# Patient Record
Sex: Female | Born: 1985
Health system: Southern US, Community
[De-identification: ages and names within clinical notes are randomized; demographics above are authoritative.]

---
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
## 2007-02-27 ENCOUNTER — Observation Stay: Payer: Self-pay

## 2007-09-07 ENCOUNTER — Emergency Department: Payer: Self-pay | Admitting: Emergency Medicine

## 2007-11-05 ENCOUNTER — Emergency Department: Payer: Self-pay | Admitting: Emergency Medicine

## 2008-01-06 ENCOUNTER — Emergency Department: Payer: Self-pay | Admitting: Emergency Medicine

## 2008-04-30 ENCOUNTER — Emergency Department: Payer: Self-pay | Admitting: Emergency Medicine

## 2010-07-13 ENCOUNTER — Emergency Department: Payer: Self-pay | Admitting: Emergency Medicine

## 2011-05-02 ENCOUNTER — Inpatient Hospital Stay (HOSPITAL_COMMUNITY)
Admission: AD | Admit: 2011-05-02 | Discharge: 2011-05-02 | Discharge status: Against Medical Advice | Payer: Self-pay | Source: Ambulatory Visit | Attending: Obstetrics & Gynecology | Admitting: Obstetrics & Gynecology

## 2011-05-02 NOTE — Plan of Care (Signed)
Patient indicated to admitting that she is unable to stay and be seen. Has to leave to pick up a child. States she will return if she needs to.

## 2012-02-15 ENCOUNTER — Emergency Department: Payer: Self-pay | Admitting: Emergency Medicine

## 2012-02-15 LAB — URINALYSIS, COMPLETE
Blood: NEGATIVE
Nitrite: NEGATIVE
Ph: 5 (ref 4.5–8.0)

## 2012-02-15 LAB — COMPREHENSIVE METABOLIC PANEL
Albumin: 3.6 g/dL (ref 3.4–5.0)
Alkaline Phosphatase: 66 U/L (ref 50–136)
Anion Gap: 9 (ref 7–16)
BUN: 14 mg/dL (ref 7–18)
Bilirubin,Total: 0.2 mg/dL (ref 0.2–1.0)
Calcium, Total: 8.5 mg/dL (ref 8.5–10.1)
Creatinine: 0.99 mg/dL (ref 0.60–1.30)
EGFR (African American): >60
EGFR (Non-African Amer.): >60
Glucose: 117 mg/dL — ABNORMAL HIGH (ref 65–99)
Osmolality: 281 (ref 275–301)
Potassium: 3.4 mmol/L — ABNORMAL LOW (ref 3.5–5.1)
Sodium: 140 mmol/L (ref 136–145)
Total Protein: 7.7 g/dL (ref 6.4–8.2)

## 2012-02-15 LAB — LIPASE, BLOOD: Lipase: 140 U/L (ref 73–393)

## 2012-02-15 LAB — HCG, QUANTITATIVE, PREGNANCY: Beta Hcg, Quant.: 14 m[IU]/mL — ABNORMAL HIGH

## 2012-02-15 LAB — CBC
HGB: 12.7 g/dL (ref 12.0–16.0)
MCH: 27.7 pg (ref 26.0–34.0)
MCHC: 33.3 g/dL (ref 32.0–36.0)
RDW: 13.9 % (ref 11.5–14.5)

## 2013-11-06 ENCOUNTER — Emergency Department: Payer: Self-pay | Admitting: Emergency Medicine

## 2014-01-22 ENCOUNTER — Ambulatory Visit: Payer: Self-pay | Admitting: Internal Medicine

## 2015-03-05 DIAGNOSIS — R51 Headache: Secondary | ICD-10-CM | POA: Diagnosis not present

## 2015-03-05 DIAGNOSIS — F410 Panic disorder [episodic paroxysmal anxiety] without agoraphobia: Secondary | ICD-10-CM | POA: Diagnosis not present

## 2015-03-05 DIAGNOSIS — R011 Cardiac murmur, unspecified: Secondary | ICD-10-CM | POA: Diagnosis not present

## 2015-03-05 DIAGNOSIS — R079 Chest pain, unspecified: Secondary | ICD-10-CM | POA: Diagnosis not present

## 2015-04-26 MED FILL — AMOXICILLIN 500 MG CAPSULE: 500 | 7 days supply | Qty: 21 | Fill #0

## 2015-04-26 MED FILL — HYDROCODON-APAP 5-325: 5-325 | 4 days supply | Qty: 12 | Fill #0

## 2015-04-27 DIAGNOSIS — K029 Dental caries, unspecified: Secondary | ICD-10-CM | POA: Diagnosis not present

## 2015-04-27 DIAGNOSIS — K0889 Other specified disorders of teeth and supporting structures: Secondary | ICD-10-CM | POA: Diagnosis not present

## 2015-05-03 MED FILL — PENICILLIN VK 500 MG TABLET: 500 | 8 days supply | Qty: 32 | Fill #0

## 2015-05-03 MED FILL — HYDROCOD-IBU 7.5-200 TAB: 7.5-200 | 5 days supply | Qty: 20 | Fill #0

## 2015-08-08 DIAGNOSIS — Z0000 Encounter for general adult medical examination without abnormal findings: Secondary | ICD-10-CM | POA: Diagnosis not present

## 2015-08-08 DIAGNOSIS — Z1389 Encounter for screening for other disorder: Secondary | ICD-10-CM | POA: Diagnosis not present

## 2015-08-08 DIAGNOSIS — F321 Major depressive disorder, single episode, moderate: Secondary | ICD-10-CM | POA: Diagnosis not present

## 2015-08-08 DIAGNOSIS — E669 Obesity, unspecified: Secondary | ICD-10-CM | POA: Diagnosis not present

## 2015-08-08 DIAGNOSIS — Z113 Encounter for screening for infections with a predominantly sexual mode of transmission: Secondary | ICD-10-CM | POA: Diagnosis not present

## 2015-08-08 DIAGNOSIS — Z30432 Encounter for removal of intrauterine contraceptive device: Secondary | ICD-10-CM | POA: Diagnosis not present

## 2015-08-08 DIAGNOSIS — R300 Dysuria: Secondary | ICD-10-CM | POA: Diagnosis not present

## 2015-08-08 DIAGNOSIS — Z131 Encounter for screening for diabetes mellitus: Secondary | ICD-10-CM | POA: Diagnosis not present

## 2015-08-08 DIAGNOSIS — F17200 Nicotine dependence, unspecified, uncomplicated: Secondary | ICD-10-CM | POA: Diagnosis not present

## 2015-10-05 ENCOUNTER — Telehealth: Payer: Self-pay | Admitting: Family

## 2015-10-05 DIAGNOSIS — B373 Candidiasis of vulva and vagina: Secondary | ICD-10-CM

## 2015-10-05 DIAGNOSIS — B3731 Acute candidiasis of vulva and vagina: Secondary | ICD-10-CM

## 2015-10-05 MED ORDER — FLUCONAZOLE 150 MG PO TABS: 150.0000 mg | ORAL_TABLET | Freq: Once | ORAL | 0 refills | Status: AC

## 2015-10-05 MED FILL — FLUCONAZOLE 150 MG TABLET: 150 | 1 days supply | Qty: 1 | Fill #0

## 2015-10-05 NOTE — Progress Notes (Signed)

## 2015-10-31 DIAGNOSIS — N390 Urinary tract infection, site not specified: Secondary | ICD-10-CM | POA: Diagnosis not present

## 2015-10-31 DIAGNOSIS — A64 Unspecified sexually transmitted disease: Secondary | ICD-10-CM | POA: Diagnosis not present

## 2015-10-31 DIAGNOSIS — F329 Major depressive disorder, single episode, unspecified: Secondary | ICD-10-CM | POA: Diagnosis not present

## 2015-10-31 DIAGNOSIS — A5901 Trichomonal vulvovaginitis: Secondary | ICD-10-CM | POA: Diagnosis not present

## 2015-10-31 DIAGNOSIS — N898 Other specified noninflammatory disorders of vagina: Secondary | ICD-10-CM | POA: Diagnosis not present

## 2015-10-31 DIAGNOSIS — R7303 Prediabetes: Secondary | ICD-10-CM | POA: Diagnosis not present

## 2015-10-31 DIAGNOSIS — B9689 Other specified bacterial agents as the cause of diseases classified elsewhere: Secondary | ICD-10-CM | POA: Diagnosis not present

## 2015-10-31 DIAGNOSIS — F17200 Nicotine dependence, unspecified, uncomplicated: Secondary | ICD-10-CM | POA: Diagnosis not present

## 2015-10-31 DIAGNOSIS — Z79899 Other long term (current) drug therapy: Secondary | ICD-10-CM | POA: Diagnosis not present

## 2015-10-31 DIAGNOSIS — N760 Acute vaginitis: Secondary | ICD-10-CM | POA: Diagnosis not present

## 2015-11-18 ENCOUNTER — Encounter (HOSPITAL_COMMUNITY): Payer: Self-pay | Admitting: Emergency Medicine

## 2015-11-18 ENCOUNTER — Ambulatory Visit (HOSPITAL_COMMUNITY)
Admission: EM | Admit: 2015-11-18 | Discharge: 2015-11-18 | Disposition: A | Discharge status: Home / Self Care | Payer: 59 | Attending: Internal Medicine | Admitting: Internal Medicine

## 2015-11-18 DIAGNOSIS — S0501XA Injury of conjunctiva and corneal abrasion without foreign body, right eye, initial encounter: Secondary | ICD-10-CM

## 2015-11-18 MED ORDER — TETRACAINE HCL 0.5 % OP SOLN
1.0000 [drp] | Freq: Once | OPHTHALMIC | Status: AC
  Administered 2015-11-18: 1 [drp] via OPHTHALMIC

## 2015-11-18 MED ORDER — ERYTHROMYCIN 5 MG/GM OP OINT: TOPICAL_OINTMENT | OPHTHALMIC | 0 refills | Status: AC

## 2015-11-18 MED ORDER — TETRACAINE HCL 0.5 % OP SOLN
OPHTHALMIC | Status: AC
  Filled 2015-11-18: qty 2

## 2015-11-18 MED ORDER — LIDOCAINE-EPINEPHRINE-TETRACAINE (LET) SOLUTION: 3.0000 mL | Freq: Once | NASAL | Status: DC

## 2015-11-18 MED FILL — ERYTHROMYCIN EYE OINTMENT: 5 | 10 days supply | Qty: 4 | Fill #0

## 2015-11-18 NOTE — ED Triage Notes (Signed)
Reports abrasion to right eye onset last night.... Reports she slept w/her contact  Sx include: watery, redness, and irritation  A&O x4... NAD

## 2015-11-18 NOTE — ED Provider Notes (Signed)
CSN: [id number]     Arrival date & time 11/18/15  1026 History   None    Chief Complaint  Patient presents with  . Eye Pain   (Consider location/radiation/quality/duration/timing/severity/associated sxs/prior Treatment) Pt reports onset of irritation to her (R) eye last night. This am she awoke w/ increased pain and redness to same eye. Relates that she slept in her contacts prior to onset of irritation.    The history is provided by the patient.  Eye Pain  This is a new problem. The current episode started 12 to 24 hours ago. The problem occurs constantly. The problem has been gradually worsening. She has tried nothing for the symptoms.    History reviewed. No pertinent past medical history. History reviewed. No pertinent surgical history. History reviewed. No pertinent family history. Social History  Substance Use Topics  . Smoking status: Current Every Day Smoker  . Smokeless tobacco: Never Used  . Alcohol use Yes   OB History    No data available     Review of Systems  Eyes: Positive for pain.  All other systems reviewed and are negative.   Allergies  Review of patient's allergies indicates no known allergies.  Home Medications   Prior to Admission medications   Medication Sig Start Date End Date Taking? Authorizing Provider  erythromycin ophthalmic ointment Place a 1/2 inch ribbon of ointment into the lower eyelid 6 times a day. 11/18/15   Leanne ChangKatherine P Demarrio Menges, NP   Meds Ordered and Administered this Visit   Medications  tetracaine (PONTOCAINE) 0.5 % ophthalmic solution 1 drop (1 drop Right Eye Given 11/18/15 1205)    BP 143/83 (BP Location: Left Arm)   Pulse 64   Temp 98.3 F (36.8 C) (Oral)   Resp 12   SpO2 100%  No data found.   Physical Exam  Constitutional: She is oriented to person, place, and time. She appears well-developed and well-nourished.  HENT:  Head: Atraumatic.  Eyes: EOM and lids are normal. Pupils are equal, round, and reactive to  light. Lids are everted and swept, no foreign bodies found. Right eye exhibits no discharge. Left eye exhibits no discharge. Right conjunctiva is injected. Right conjunctiva has no hemorrhage. Left conjunctiva is not injected. Left conjunctiva has no hemorrhage. No scleral icterus.    Fluorescein uptake noted just lateral and superior to (R) inner canthus c/w corneal abrasion. No obvious fb.   Cardiovascular: Normal rate.   Pulmonary/Chest: Effort normal.  Neurological: She is alert and oriented to person, place, and time.  Skin: Skin is warm and dry.  Psychiatric: She has a normal mood and affect.    Urgent Care Course   Clinical Course    Procedures (including critical care time)  Labs Review Labs Reviewed - No data to display  Imaging Review No results found.   Visual Acuity Review  Right Eye Distance:   Left Eye Distance:   Bilateral Distance:    Right Eye Near:   Left Eye Near:    Bilateral Near:         MDM   1. Corneal abrasion, right, initial encounter   Rx: Erythromycin Opthal ointment (x 4-6 qd x 7 days) Strongly encouraged f/u w/ opthalmology (referral provided) IN 24-48 hrs if symptoms not improving or worsening. Home care discussed and provided in print.    Leanne ChangKatherine P Vrinda Heckstall, NP 11/21/15 0007    Roma KayserKatherine P Eshan Trupiano, NP 11/21/15 [id number]

## 2016-03-23 DIAGNOSIS — R300 Dysuria: Secondary | ICD-10-CM | POA: Diagnosis not present

## 2016-03-23 DIAGNOSIS — R10819 Abdominal tenderness, unspecified site: Secondary | ICD-10-CM | POA: Diagnosis not present

## 2016-03-23 DIAGNOSIS — R1032 Left lower quadrant pain: Secondary | ICD-10-CM | POA: Diagnosis not present

## 2016-03-23 DIAGNOSIS — R350 Frequency of micturition: Secondary | ICD-10-CM | POA: Diagnosis not present

## 2016-03-23 DIAGNOSIS — N760 Acute vaginitis: Secondary | ICD-10-CM | POA: Diagnosis not present

## 2016-03-23 DIAGNOSIS — R110 Nausea: Secondary | ICD-10-CM | POA: Diagnosis not present

## 2016-03-23 DIAGNOSIS — F17200 Nicotine dependence, unspecified, uncomplicated: Secondary | ICD-10-CM | POA: Diagnosis not present

## 2016-03-24 DIAGNOSIS — N760 Acute vaginitis: Secondary | ICD-10-CM | POA: Diagnosis not present

## 2016-03-24 DIAGNOSIS — R1032 Left lower quadrant pain: Secondary | ICD-10-CM | POA: Diagnosis not present

## 2016-03-24 DIAGNOSIS — F17200 Nicotine dependence, unspecified, uncomplicated: Secondary | ICD-10-CM | POA: Diagnosis not present

## 2016-03-24 DIAGNOSIS — R350 Frequency of micturition: Secondary | ICD-10-CM | POA: Diagnosis not present

## 2016-03-24 DIAGNOSIS — R10819 Abdominal tenderness, unspecified site: Secondary | ICD-10-CM | POA: Diagnosis not present

## 2016-03-24 DIAGNOSIS — R300 Dysuria: Secondary | ICD-10-CM | POA: Diagnosis not present

## 2016-03-28 MED FILL — metroNIDAZOLE 500 MG TABS: 500 | 7 days supply | Qty: 14 | Fill #0

## 2016-07-19 DIAGNOSIS — H5213 Myopia, bilateral: Secondary | ICD-10-CM | POA: Diagnosis not present

## 2016-08-13 DIAGNOSIS — Z1389 Encounter for screening for other disorder: Secondary | ICD-10-CM | POA: Diagnosis not present

## 2016-08-13 DIAGNOSIS — R7303 Prediabetes: Secondary | ICD-10-CM | POA: Diagnosis not present

## 2016-08-13 DIAGNOSIS — F331 Major depressive disorder, recurrent, moderate: Secondary | ICD-10-CM | POA: Diagnosis not present

## 2016-08-13 DIAGNOSIS — J029 Acute pharyngitis, unspecified: Secondary | ICD-10-CM | POA: Diagnosis not present

## 2017-08-31 ENCOUNTER — Telehealth: Payer: 59 | Admitting: Family

## 2017-08-31 DIAGNOSIS — B3731 Acute candidiasis of vulva and vagina: Secondary | ICD-10-CM

## 2017-08-31 DIAGNOSIS — N390 Urinary tract infection, site not specified: Secondary | ICD-10-CM

## 2017-08-31 DIAGNOSIS — B373 Candidiasis of vulva and vagina: Secondary | ICD-10-CM

## 2017-08-31 MED ORDER — CEPHALEXIN 500 MG PO CAPS: 500.0000 mg | ORAL_CAPSULE | Freq: Two times a day (BID) | ORAL | 0 refills | Status: DC

## 2017-08-31 MED ORDER — FLUCONAZOLE 150 MG PO TABS: 150.0000 mg | ORAL_TABLET | Freq: Once | ORAL | 0 refills | Status: AC

## 2017-08-31 NOTE — Progress Notes (Signed)
Thank you for the details you included in the comment boxes. Those details are very helpful in determining the best course of treatment for you and help us to provide the best care. I will also send the Diflucan 150mg  x 1 dose for you.  We are sorry that you are not feeling well.  Here is how we plan to help!  Based on what you shared with me it looks like you most likely have a simple urinary tract infection.  A UTI (Urinary Tract Infection) is a bacterial infection of the bladder.  Most cases of urinary tract infections are simple to treat but a key part of your care is to encourage you to drink plenty of fluids and watch your symptoms carefully.  I have prescribed Keflex 500 mg twice a day for 7 days.  Your symptoms should gradually improve. Call us if the burning in your urine worsens, you develop worsening fever, back pain or pelvic pain or if your symptoms do not resolve after completing the antibiotic.  Urinary tract infections can be prevented by drinking plenty of water to keep your body hydrated.  Also be sure when you wipe, wipe from front to back and don't hold it in!  If possible, empty your bladder every 4 hours.  Your e-visit answers were reviewed by a board certified advanced clinical practitioner to complete your personal care plan.  Depending on the condition, your plan could have included both over the counter or prescription medications.  If there is a problem please reply  once you have received a response from your provider.  Your safety is important to us.  If you have drug allergies check your prescription carefully.    You can use MyChart to ask questions about today's visit, request a non-urgent call back, or ask for a work or school excuse for 24 hours related to this e-Visit. If it has been greater than 24 hours you will need to follow up with your provider, or enter a new e-Visit to address those concerns.   You will get an e-mail in the next two days asking about  your experience.  I hope that your e-visit has been valuable and will speed your recovery. Thank you for using e-visits.

## 2017-09-05 DIAGNOSIS — Z5321 Procedure and treatment not carried out due to patient leaving prior to being seen by health care provider: Secondary | ICD-10-CM | POA: Diagnosis not present

## 2017-09-06 DIAGNOSIS — N3001 Acute cystitis with hematuria: Secondary | ICD-10-CM | POA: Diagnosis not present

## 2017-09-06 DIAGNOSIS — L0291 Cutaneous abscess, unspecified: Secondary | ICD-10-CM | POA: Diagnosis not present

## 2017-09-06 MED FILL — DOXYCYCLINE HYCLATE 100 MG: 100 | 7 days supply | Qty: 14 | Fill #0

## 2017-09-12 DIAGNOSIS — L732 Hidradenitis suppurativa: Secondary | ICD-10-CM | POA: Diagnosis not present

## 2017-09-12 DIAGNOSIS — L0291 Cutaneous abscess, unspecified: Secondary | ICD-10-CM | POA: Diagnosis not present

## 2018-01-22 DIAGNOSIS — H5213 Myopia, bilateral: Secondary | ICD-10-CM | POA: Diagnosis not present

## 2018-02-28 ENCOUNTER — Encounter: Payer: Self-pay | Admitting: Physician Assistant

## 2018-02-28 DIAGNOSIS — R110 Nausea: Secondary | ICD-10-CM | POA: Diagnosis not present

## 2018-02-28 DIAGNOSIS — L298 Other pruritus: Secondary | ICD-10-CM | POA: Diagnosis not present

## 2018-02-28 DIAGNOSIS — F17200 Nicotine dependence, unspecified, uncomplicated: Secondary | ICD-10-CM | POA: Diagnosis not present

## 2018-02-28 DIAGNOSIS — L292 Pruritus vulvae: Secondary | ICD-10-CM | POA: Diagnosis not present

## 2018-02-28 DIAGNOSIS — R300 Dysuria: Secondary | ICD-10-CM | POA: Diagnosis not present

## 2018-02-28 DIAGNOSIS — R350 Frequency of micturition: Secondary | ICD-10-CM | POA: Diagnosis not present

## 2018-02-28 NOTE — Progress Notes (Signed)
This encounter was created in error - please disregard.

## 2018-03-01 DIAGNOSIS — R300 Dysuria: Secondary | ICD-10-CM | POA: Diagnosis not present

## 2018-03-01 DIAGNOSIS — L298 Other pruritus: Secondary | ICD-10-CM | POA: Diagnosis not present

## 2018-03-01 DIAGNOSIS — F17200 Nicotine dependence, unspecified, uncomplicated: Secondary | ICD-10-CM | POA: Diagnosis not present

## 2018-03-13 DIAGNOSIS — R7303 Prediabetes: Secondary | ICD-10-CM | POA: Diagnosis not present

## 2018-03-13 DIAGNOSIS — M791 Myalgia, unspecified site: Secondary | ICD-10-CM | POA: Diagnosis not present

## 2018-03-13 DIAGNOSIS — Z0000 Encounter for general adult medical examination without abnormal findings: Secondary | ICD-10-CM | POA: Diagnosis not present

## 2018-03-13 DIAGNOSIS — N941 Unspecified dyspareunia: Secondary | ICD-10-CM | POA: Diagnosis not present

## 2018-03-13 DIAGNOSIS — N939 Abnormal uterine and vaginal bleeding, unspecified: Secondary | ICD-10-CM | POA: Diagnosis not present

## 2018-03-13 DIAGNOSIS — R102 Pelvic and perineal pain: Secondary | ICD-10-CM | POA: Diagnosis not present

## 2018-03-24 MED FILL — metroNIDAZOLE 500 MG TABS: 500 | 7 days supply | Qty: 14 | Fill #0

## 2018-03-25 DIAGNOSIS — N839 Noninflammatory disorder of ovary, fallopian tube and broad ligament, unspecified: Secondary | ICD-10-CM | POA: Diagnosis not present

## 2018-03-25 DIAGNOSIS — N888 Other specified noninflammatory disorders of cervix uteri: Secondary | ICD-10-CM | POA: Diagnosis not present

## 2018-07-10 ENCOUNTER — Other Ambulatory Visit: Payer: Self-pay

## 2018-07-10 ENCOUNTER — Emergency Department (HOSPITAL_COMMUNITY): Payer: 59

## 2018-07-10 ENCOUNTER — Emergency Department (HOSPITAL_COMMUNITY)
Admission: EM | Admit: 2018-07-10 | Discharge: 2018-07-10 | Disposition: A | Discharge status: Home / Self Care | Payer: 59 | Attending: Emergency Medicine | Admitting: Emergency Medicine

## 2018-07-10 ENCOUNTER — Encounter (HOSPITAL_COMMUNITY): Payer: Self-pay

## 2018-07-10 DIAGNOSIS — M7918 Myalgia, other site: Secondary | ICD-10-CM | POA: Diagnosis not present

## 2018-07-10 DIAGNOSIS — Z03818 Encounter for observation for suspected exposure to other biological agents ruled out: Secondary | ICD-10-CM | POA: Diagnosis not present

## 2018-07-10 DIAGNOSIS — R509 Fever, unspecified: Secondary | ICD-10-CM | POA: Diagnosis not present

## 2018-07-10 DIAGNOSIS — Z20828 Contact with and (suspected) exposure to other viral communicable diseases: Secondary | ICD-10-CM | POA: Diagnosis not present

## 2018-07-10 DIAGNOSIS — F17200 Nicotine dependence, unspecified, uncomplicated: Secondary | ICD-10-CM | POA: Diagnosis not present

## 2018-07-10 DIAGNOSIS — R52 Pain, unspecified: Secondary | ICD-10-CM | POA: Insufficient documentation

## 2018-07-10 DIAGNOSIS — R05 Cough: Secondary | ICD-10-CM | POA: Diagnosis not present

## 2018-07-10 LAB — I-STAT BETA HCG BLOOD, ED (MC, WL, AP ONLY): I-stat hCG, quantitative: <5 m[IU]/mL (ref <5)

## 2018-07-10 LAB — SARS CORONAVIRUS 2 BY RT PCR (HOSPITAL ORDER, PERFORMED IN ~~LOC~~ HOSPITAL LAB): SARS Coronavirus 2: NEGATIVE

## 2018-07-10 MED ORDER — IBUPROFEN 400 MG PO TABS: 400.0000 mg | ORAL_TABLET | Freq: Four times a day (QID) | ORAL | 0 refills | Status: DC | PRN

## 2018-07-10 MED ORDER — IBUPROFEN 800 MG PO TABS
800.0000 mg | ORAL_TABLET | Freq: Once | ORAL | Status: AC
  Administered 2018-07-10: 800 mg via ORAL
  Filled 2018-07-10: qty 1

## 2018-07-10 MED ORDER — ACETAMINOPHEN 500 MG PO TABS
1000.0000 mg | ORAL_TABLET | Freq: Once | ORAL | Status: AC
  Administered 2018-07-10: 03:00:00 1000 mg via ORAL
  Filled 2018-07-10: qty 2

## 2018-07-10 NOTE — ED Provider Notes (Signed)
[facility]-EMERGENCY DEPT Provider Note   CSN: [id number] Arrival date & time: 07/10/18  0017    History   Chief Complaint Chief Complaint  Patient presents with  . Fever  . Generalized Body Aches    HPI Lisa Irwin is a 33 y.o. female.     The history is provided by the patient.  Fever  Max temp prior to arrival:  101 Temp source:  Oral Severity:  Mild Onset quality:  Sudden Timing:  Constant Progression:  Resolved (states she took her temp just PTA and it was 101 and she did not take any medication she came straight here.) Chronicity:  New Relieved by:  Nothing Worsened by:  Nothing Ineffective treatments:  None tried Associated symptoms: myalgias and rhinorrhea   Associated symptoms: no chest pain, no chills, no confusion, no congestion, no cough, no diarrhea, no dysuria, no ear pain, no nausea, no rash, no somnolence and no vomiting   Risk factors: no recent sickness   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  No CP, no SOB, no anosmia.    History reviewed. No pertinent past medical history.  There are no active problems to display for this patient.   History reviewed. No pertinent surgical history.   OB History   No obstetric history on file.      Home Medications    Prior to Admission medications   Medication Sig Start Date End Date Taking? Authorizing Provider  cephALEXin (KEFLEX) 500 MG capsule Take 1 capsule (500 mg total) by mouth 2 (two) times daily. 08/31/17   Withrow, Everardo All, FNP  erythromycin ophthalmic ointment Place a 1/2 inch ribbon of ointment into the lower eyelid 6 times a day. 11/18/15   Schorr, Roma Kayser, NP  ibuprofen (ADVIL) 400 MG tablet Take 1 tablet (400 mg total) by mouth every 6 (six) hours as needed. 07/10/18   Zeriyah Wain, MD    Family History History reviewed. No pertinent family history.  Social History Social History   Tobacco Use  . Smoking status:  Current Every Day Smoker  . Smokeless tobacco: Never Used  Substance Use Topics  . Alcohol use: Yes  . Drug use: Not on file     Allergies   Patient has no known allergies.   Review of Systems Review of Systems  Constitutional: Positive for fever. Negative for chills.  HENT: Positive for rhinorrhea. Negative for congestion and ear pain.   Eyes: Negative for photophobia.  Respiratory: Negative for cough and shortness of breath.   Cardiovascular: Negative for chest pain.  Gastrointestinal: Negative for diarrhea, nausea and vomiting.  Genitourinary: Negative for dysuria.  Musculoskeletal: Positive for myalgias. Negative for neck pain and neck stiffness.  Skin: Negative for rash.  Neurological: Negative for weakness and numbness.  Psychiatric/Behavioral: Negative for confusion.  All other systems reviewed and are negative.    Physical Exam Updated Vital Signs BP (!) 145/96 (BP Location: Left Arm)   Pulse 83   Temp 99.1 F (37.3 C) (Oral)   Resp 20   LMP 06/08/2018   SpO2 99%   Physical Exam Vitals signs and nursing note reviewed.  Constitutional:      General: She is not in acute distress. HENT:     Head: Normocephalic and atraumatic.     Nose: Nose normal.     Mouth/Throat:     Mouth: Mucous membranes are moist.     Pharynx: Oropharynx is clear.  Eyes:     Conjunctiva/sclera: Conjunctivae normal.     Pupils: Pupils are equal, round, and reactive to light.  Neck:     Musculoskeletal: Normal range of motion and neck supple.  Cardiovascular:     Rate and Rhythm: Normal rate and regular rhythm.     Pulses: Normal pulses.     Heart sounds: Normal heart sounds.  Pulmonary:     Effort: Pulmonary effort is normal. No respiratory distress.     Breath sounds: Normal breath sounds. No wheezing or rales.  Abdominal:     General: Abdomen is flat. Bowel sounds are normal.     Tenderness: There is no abdominal tenderness. There is no guarding or rebound.   Musculoskeletal: Normal range of motion.     Right lower leg: No edema.     Left lower leg: No edema.  Lymphadenopathy:     Cervical: No cervical adenopathy.  Skin:    General: Skin is warm and dry.     Capillary Refill: Capillary refill takes less than 2 seconds.  Neurological:     General: No focal deficit present.     Mental Status: She is alert and oriented to person, place, and time.     Deep Tendon Reflexes: Reflexes normal.  Psychiatric:        Mood and Affect: Mood normal.        Behavior: Behavior normal.      ED Treatments / Results  Labs (all labs ordered are listed, but only abnormal results are displayed) Results for orders placed or performed during the hospital encounter of 07/10/18  SARS Coronavirus 2 (CEPHEID- Performed in [facility] hospital lab), [facility] Order  Result Value Ref Range   SARS Coronavirus 2 NEGATIVE NEGATIVE  I-Stat Beta hCG blood, ED ([facility], [facility], [facility] only)  Result Value Ref Range   I-stat hCG, quantitative <5.0 <5 mIU/mL   Comment 3           Dg Chest Portable 1 View  Result Date: 07/10/2018 CLINICAL DATA:  33 year old female with cough body ache and fever. EXAM: PORTABLE CHEST 1 VIEW COMPARISON:  None. FINDINGS: Portable AP semi upright view at 0016 hours. Lung volumes and mediastinal contours are within normal limits. Visualized tracheal air column is within normal limits. Allowing for portable technique the lungs are clear. No osseous abnormality identified. Paucity of bowel gas in the upper abdomen. IMPRESSION: Negative portable chest Electronically Signed   By: Odessa Fleming M.D.   On: 07/10/2018 00:58    Radiology Dg Chest Portable 1 View  Result Date: 07/10/2018 CLINICAL DATA:  33 year old female with cough body ache and fever. EXAM: PORTABLE CHEST 1 VIEW COMPARISON:  None. FINDINGS: Portable AP semi upright view at 0016 hours. Lung volumes and mediastinal contours are within normal limits. Visualized tracheal air column is within normal limits.  Allowing for portable technique the lungs are clear. No osseous abnormality identified. Paucity of bowel gas in the upper abdomen. IMPRESSION: Negative portable chest Electronically Signed   By: Odessa Fleming M.D.   On: 07/10/2018 00:58    Procedures Procedures (including critical care time)  Medications Ordered in ED Medications  acetaminophen (TYLENOL) tablet 1,000 mg (has no administration in time range)  ibuprofen (ADVIL) tablet 800 mg (has no administration in time range)    Rapid covid is negative, patient is safe to return to work.    I suspect the thermometer was inaccurate as the patient took nothing and came straight here and is afebrile  in the ED.    I have reviewed her labs and Xray with the patient.    Based on CENTOR criteria there is no indication for further testing or treatment.    Ibuprofen sent to the patient's pharmacy.     Final Clinical Impressions(s) / ED Diagnoses   Final diagnoses:  Body aches   Return for intractable cough, coughing up blood,fevers >100.4 unrelieved by medication, shortness of breath, intractable vomiting, chest pain, shortness of breath, weakness,numbness, changes in speech, facial asymmetry,abdominal pain, passing out,Inability to tolerate liquids or food, cough, altered mental status or any concerns. No signs of systemic illness or infection. The patient is nontoxic-appearing on exam and vital signs are within normal limits.   I have reviewed the triage vital signs and the nursing notes. Pertinent labs &imaging results that were available during my care of the patient were reviewed by me and considered in my medical decision making (see chart for details).  After history, exam, and medical workup I feel the patient has been appropriately medically screened and is safe for discharge home. Pertinent diagnoses were discussed with the patient. Patient was given return precautions ED Discharge Orders         Ordered    ibuprofen  (ADVIL) 400 MG tablet  Every 6 hours PRN     07/10/18 0225           Analya Louissaint, MD 07/10/18 [id number]

## 2018-07-10 NOTE — ED Notes (Signed)
Bed: GE36 Expected date:  Expected time:  Means of arrival:  Comments: 38M cough from [place]

## 2018-07-10 NOTE — ED Triage Notes (Signed)
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

## 2018-07-17 ENCOUNTER — Encounter: Payer: Self-pay | Admitting: Emergency Medicine

## 2018-07-17 ENCOUNTER — Ambulatory Visit
Admission: EM | Admit: 2018-07-17 | Discharge: 2018-07-17 | Disposition: A | Discharge status: Home / Self Care | Payer: 59 | Attending: Physician Assistant | Admitting: Physician Assistant

## 2018-07-17 ENCOUNTER — Other Ambulatory Visit: Payer: Self-pay

## 2018-07-17 DIAGNOSIS — M25511 Pain in right shoulder: Secondary | ICD-10-CM

## 2018-07-17 MED ORDER — MELOXICAM 7.5 MG PO TABS: 7.5000 mg | ORAL_TABLET | Freq: Every day | ORAL | 0 refills | Status: DC

## 2018-07-17 MED ORDER — KETOROLAC TROMETHAMINE 30 MG/ML IJ SOLN
30.0000 mg | Freq: Once | INTRAMUSCULAR | Status: AC
  Administered 2018-07-17: 30 mg via INTRAMUSCULAR

## 2018-07-17 MED ORDER — METHOCARBAMOL 500 MG PO TABS: 500.0000 mg | ORAL_TABLET | Freq: Two times a day (BID) | ORAL | 0 refills | Status: DC

## 2018-07-17 MED FILL — METHOCARBAMOL 500 MG TABS: 500 | 10 days supply | Qty: 20 | Fill #0

## 2018-07-17 MED FILL — DOXYCYCLINE HYC 100 MG CAPS: 100 | 10 days supply | Qty: 20 | Fill #0

## 2018-07-17 MED FILL — MELOXICAM 7.5 MG TABLET: 7.5 | 7 days supply | Qty: 10 | Fill #0

## 2018-07-17 NOTE — ED Notes (Signed)
Patient able to ambulate independently  

## 2018-07-17 NOTE — ED Triage Notes (Signed)
Pt present to [facility] for assessment after waking up two weeks ago to right shoulder pain.  Shoulder hurts less with arm over head

## 2018-07-17 NOTE — Discharge Instructions (Signed)
Toradol injection in office today. Start Mobic. Do not take ibuprofen (motrin/advil)/ naproxen (aleve) while on mobic. Robaxin as needed, this can make you drowsy, so do not take if you are going to drive, operate heavy machinery, or make important decisions. Ice/heat compresses as needed. Follow up with PCP/orthopedics for further evaluation if continues to have shoulder/neck pain.

## 2018-07-17 NOTE — ED Provider Notes (Signed)
[facility]    CSN: [id number] Arrival date & time: 07/17/18  1448     History   Chief Complaint Chief Complaint  Patient presents with  . Shoulder Pain    HPI Lisa Irwin is a 33 y.o. female.   33 year old female comes in for 2 week history of right shoulder pain. Denies injury/trauma. Pain is to the neck as well, and was mild, but now more severe. States at first felt like she slept wrong, now with throbbing/pulsating/"twitching" sensation that occasionally radiates down the arm. Elevation helps with the pain. Denies trouble gripping, numbness/tingling. No heavy lifting. Took ibuprofen 400mg  one dose, heating pad, massage without relief.  Patient was seen at the ED for fever, body aches 1 week ago. Symptoms has since then resolved. No fever. Denies cough, shortness of breath.      History reviewed. No pertinent past medical history.  There are no active problems to display for this patient.   History reviewed. No pertinent surgical history.  OB History   No obstetric history on file.      Home Medications    Prior to Admission medications   Medication Sig Start Date End Date Taking? Authorizing Provider  erythromycin ophthalmic ointment Place a 1/2 inch ribbon of ointment into the lower eyelid 6 times a day. 11/18/15   Schorr, Roma Kayser, NP  ibuprofen (ADVIL) 400 MG tablet Take 1 tablet (400 mg total) by mouth every 6 (six) hours as needed. 07/10/18   Palumbo, April, MD  meloxicam (MOBIC) 7.5 MG tablet Take 1 tablet (7.5 mg total) by mouth daily. 07/17/18   Cathie Hoops,  V, PA-C  methocarbamol (ROBAXIN) 500 MG tablet Take 1 tablet (500 mg total) by mouth 2 (two) times daily. 07/17/18   Belinda Fisher, PA-C    Family History History reviewed. No pertinent family history.  Social History Social History   Tobacco Use  . Smoking status: Former Games developer  . Smokeless tobacco: Never Used  Substance Use Topics  . Alcohol use: Yes  . Drug use: Not on file      Allergies   Patient has no known allergies.   Review of Systems Review of Systems  Reason unable to perform ROS: See HPI as above.     Physical Exam Triage Vital Signs ED Triage Vitals  Enc Vitals Group     BP 07/17/18 1457 (!) 135/91     Pulse Rate 07/17/18 1457 71     Resp 07/17/18 1457 18     Temp 07/17/18 1457 97.8 F (36.6 C)     Temp Source 07/17/18 1457 Oral     SpO2 07/17/18 1457 98 %     Weight --      Height --      Head Circumference --      Peak Flow --      Pain Score 07/17/18 1456 7     Pain Loc --      Pain Edu? --      Excl. in GC? --    No data found.  Updated Vital Signs BP (!) 135/91 (BP Location: Left Arm)   Pulse 71   Temp 97.8 F (36.6 C) (Oral)   Resp 18   LMP 07/10/2018   SpO2 98%   Physical Exam Constitutional:      General: She is not in acute distress.    Appearance: She is well-developed. She is not diaphoretic.  HENT:     Head: Normocephalic and  atraumatic.  Eyes:     Conjunctiva/sclera: Conjunctivae normal.     Pupils: Pupils are equal, round, and reactive to light.  Cardiovascular:     Rate and Rhythm: Normal rate and regular rhythm.     Heart sounds: Normal heart sounds. No murmur. No friction rub. No gallop.   Pulmonary:     Effort: Pulmonary effort is normal. No accessory muscle usage or respiratory distress.     Breath sounds: Normal breath sounds. No stridor. No decreased breath sounds, wheezing, rhonchi or rales.  Musculoskeletal:     Comments: No rash, swelling, erythema. No tenderness on palpation of the spinous processes. Tenderness to palpation of right neck, shoulder. No bony tenderness. Full range of motion of neck and shoulder. Strength normal and equal bilaterally. Sensation intact and equal bilaterally.  Radial pulses 2+ and equal bilaterally. Capillary refill less than 2 seconds.   Skin:    General: Skin is warm and dry.  Neurological:     Mental Status: She is alert and oriented to person, place, and  time.     UC Treatments / Results  Labs (all labs ordered are listed, but only abnormal results are displayed) Labs Reviewed - No data to display  EKG None  Radiology No results found.  Procedures Procedures (including critical care time)  Medications Ordered in UC Medications  ketorolac (TORADOL) 30 MG/ML injection 30 mg (30 mg Intramuscular Given 07/17/18 1518)    Initial Impression / Assessment and Plan / UC Course  I have reviewed the triage vital signs and the nursing notes.  Pertinent labs & imaging results that were available during my care of the patient were reviewed by me and considered in my medical decision making (see chart for details).    Start NSAID as directed for pain and inflammation. Muscle relaxant as needed. Ice/heat compresses. Discussed with patient strain can take up to 3-4 weeks to resolve, but should be getting better each week. Return precautions given.   Final Clinical Impressions(s) / UC Diagnoses   Final diagnoses:  Acute pain of right shoulder    ED Prescriptions    Medication Sig Dispense Auth. Provider   meloxicam (MOBIC) 7.5 MG tablet Take 1 tablet (7.5 mg total) by mouth daily. 10 tablet ,  V, PA-C   methocarbamol (ROBAXIN) 500 MG tablet Take 1 tablet (500 mg total) by mouth 2 (two) times daily. 20 tablet Threasa Alpha,  V, PA-C        ,  V, [place] 07/17/18 1524

## 2018-08-22 DIAGNOSIS — N611 Abscess of the breast and nipple: Secondary | ICD-10-CM | POA: Diagnosis not present

## 2018-08-22 DIAGNOSIS — F17210 Nicotine dependence, cigarettes, uncomplicated: Secondary | ICD-10-CM | POA: Diagnosis not present

## 2018-08-22 DIAGNOSIS — L02213 Cutaneous abscess of chest wall: Secondary | ICD-10-CM | POA: Diagnosis not present

## 2018-08-26 DIAGNOSIS — N611 Abscess of the breast and nipple: Secondary | ICD-10-CM | POA: Diagnosis not present

## 2018-08-26 DIAGNOSIS — Z87891 Personal history of nicotine dependence: Secondary | ICD-10-CM | POA: Diagnosis not present

## 2018-08-29 DIAGNOSIS — Z4801 Encounter for change or removal of surgical wound dressing: Secondary | ICD-10-CM | POA: Diagnosis not present

## 2018-09-02 DIAGNOSIS — N611 Abscess of the breast and nipple: Secondary | ICD-10-CM | POA: Diagnosis not present

## 2018-09-02 DIAGNOSIS — O9989 Other specified diseases and conditions complicating pregnancy, childbirth and the puerperium: Secondary | ICD-10-CM | POA: Diagnosis not present

## 2018-09-02 DIAGNOSIS — Z4801 Encounter for change or removal of surgical wound dressing: Secondary | ICD-10-CM | POA: Diagnosis not present

## 2018-09-02 DIAGNOSIS — Z87891 Personal history of nicotine dependence: Secondary | ICD-10-CM | POA: Diagnosis not present

## 2018-09-02 MED FILL — CEPHALEXIN 500 MG CAPSULE: 500 | 5 days supply | Qty: 15 | Fill #0

## 2018-09-08 DIAGNOSIS — M25511 Pain in right shoulder: Secondary | ICD-10-CM | POA: Diagnosis not present

## 2018-09-08 DIAGNOSIS — M542 Cervicalgia: Secondary | ICD-10-CM | POA: Diagnosis not present

## 2018-09-11 DIAGNOSIS — R51 Headache: Secondary | ICD-10-CM | POA: Diagnosis not present

## 2018-09-11 DIAGNOSIS — Z3A01 Less than 8 weeks gestation of pregnancy: Secondary | ICD-10-CM | POA: Diagnosis not present

## 2018-09-11 DIAGNOSIS — O219 Vomiting of pregnancy, unspecified: Secondary | ICD-10-CM | POA: Diagnosis not present

## 2018-09-11 DIAGNOSIS — R197 Diarrhea, unspecified: Secondary | ICD-10-CM | POA: Diagnosis not present

## 2018-09-11 DIAGNOSIS — O9989 Other specified diseases and conditions complicating pregnancy, childbirth and the puerperium: Secondary | ICD-10-CM | POA: Diagnosis not present

## 2018-09-11 DIAGNOSIS — R630 Anorexia: Secondary | ICD-10-CM | POA: Diagnosis not present

## 2018-09-11 DIAGNOSIS — Z87891 Personal history of nicotine dependence: Secondary | ICD-10-CM | POA: Diagnosis not present

## 2018-09-11 DIAGNOSIS — O210 Mild hyperemesis gravidarum: Secondary | ICD-10-CM | POA: Diagnosis not present

## 2018-09-11 DIAGNOSIS — Z20828 Contact with and (suspected) exposure to other viral communicable diseases: Secondary | ICD-10-CM | POA: Diagnosis not present

## 2018-09-12 DIAGNOSIS — R51 Headache: Secondary | ICD-10-CM | POA: Diagnosis not present

## 2018-09-12 DIAGNOSIS — O210 Mild hyperemesis gravidarum: Secondary | ICD-10-CM | POA: Diagnosis not present

## 2018-09-12 DIAGNOSIS — O9989 Other specified diseases and conditions complicating pregnancy, childbirth and the puerperium: Secondary | ICD-10-CM | POA: Diagnosis not present

## 2018-09-12 DIAGNOSIS — Z3A01 Less than 8 weeks gestation of pregnancy: Secondary | ICD-10-CM | POA: Diagnosis not present

## 2018-09-12 DIAGNOSIS — Z20828 Contact with and (suspected) exposure to other viral communicable diseases: Secondary | ICD-10-CM | POA: Diagnosis not present

## 2018-09-12 DIAGNOSIS — R197 Diarrhea, unspecified: Secondary | ICD-10-CM | POA: Diagnosis not present

## 2018-09-12 DIAGNOSIS — R630 Anorexia: Secondary | ICD-10-CM | POA: Diagnosis not present

## 2018-09-12 DIAGNOSIS — Z87891 Personal history of nicotine dependence: Secondary | ICD-10-CM | POA: Diagnosis not present

## 2018-09-12 MED FILL — DOXYLAMINE-PYRIDOXINE 10-10: 10-10 | 30 days supply | Qty: 120 | Fill #0

## 2018-09-15 MED FILL — metroNIDAZOLE 500 MG TABS: 500 | 7 days supply | Qty: 14 | Fill #0

## 2018-09-16 DIAGNOSIS — O99341 Other mental disorders complicating pregnancy, first trimester: Secondary | ICD-10-CM | POA: Diagnosis not present

## 2018-09-16 DIAGNOSIS — Z3491 Encounter for supervision of normal pregnancy, unspecified, first trimester: Secondary | ICD-10-CM | POA: Diagnosis not present

## 2018-09-16 DIAGNOSIS — Z3480 Encounter for supervision of other normal pregnancy, unspecified trimester: Secondary | ICD-10-CM | POA: Diagnosis not present

## 2018-09-16 DIAGNOSIS — F329 Major depressive disorder, single episode, unspecified: Secondary | ICD-10-CM | POA: Diagnosis not present

## 2018-09-16 MED FILL — SERTRALINE HCL 25 MG TABLET: 25 | 30 days supply | Qty: 30 | Fill #0

## 2019-03-16 MED FILL — SERTRALINE HCL 25 MG TABLET: 25 | 30 days supply | Qty: 30 | Fill #1

## 2019-10-09 IMAGING — DX PORTABLE CHEST - 1 VIEW
1 series · 1 of 1 positions shown · non-contrast
Comparison: None.

CLINICAL DATA: 33-year-old female with cough body ache and fever.

EXAM:
PORTABLE CHEST 1 VIEW

[chest ap]
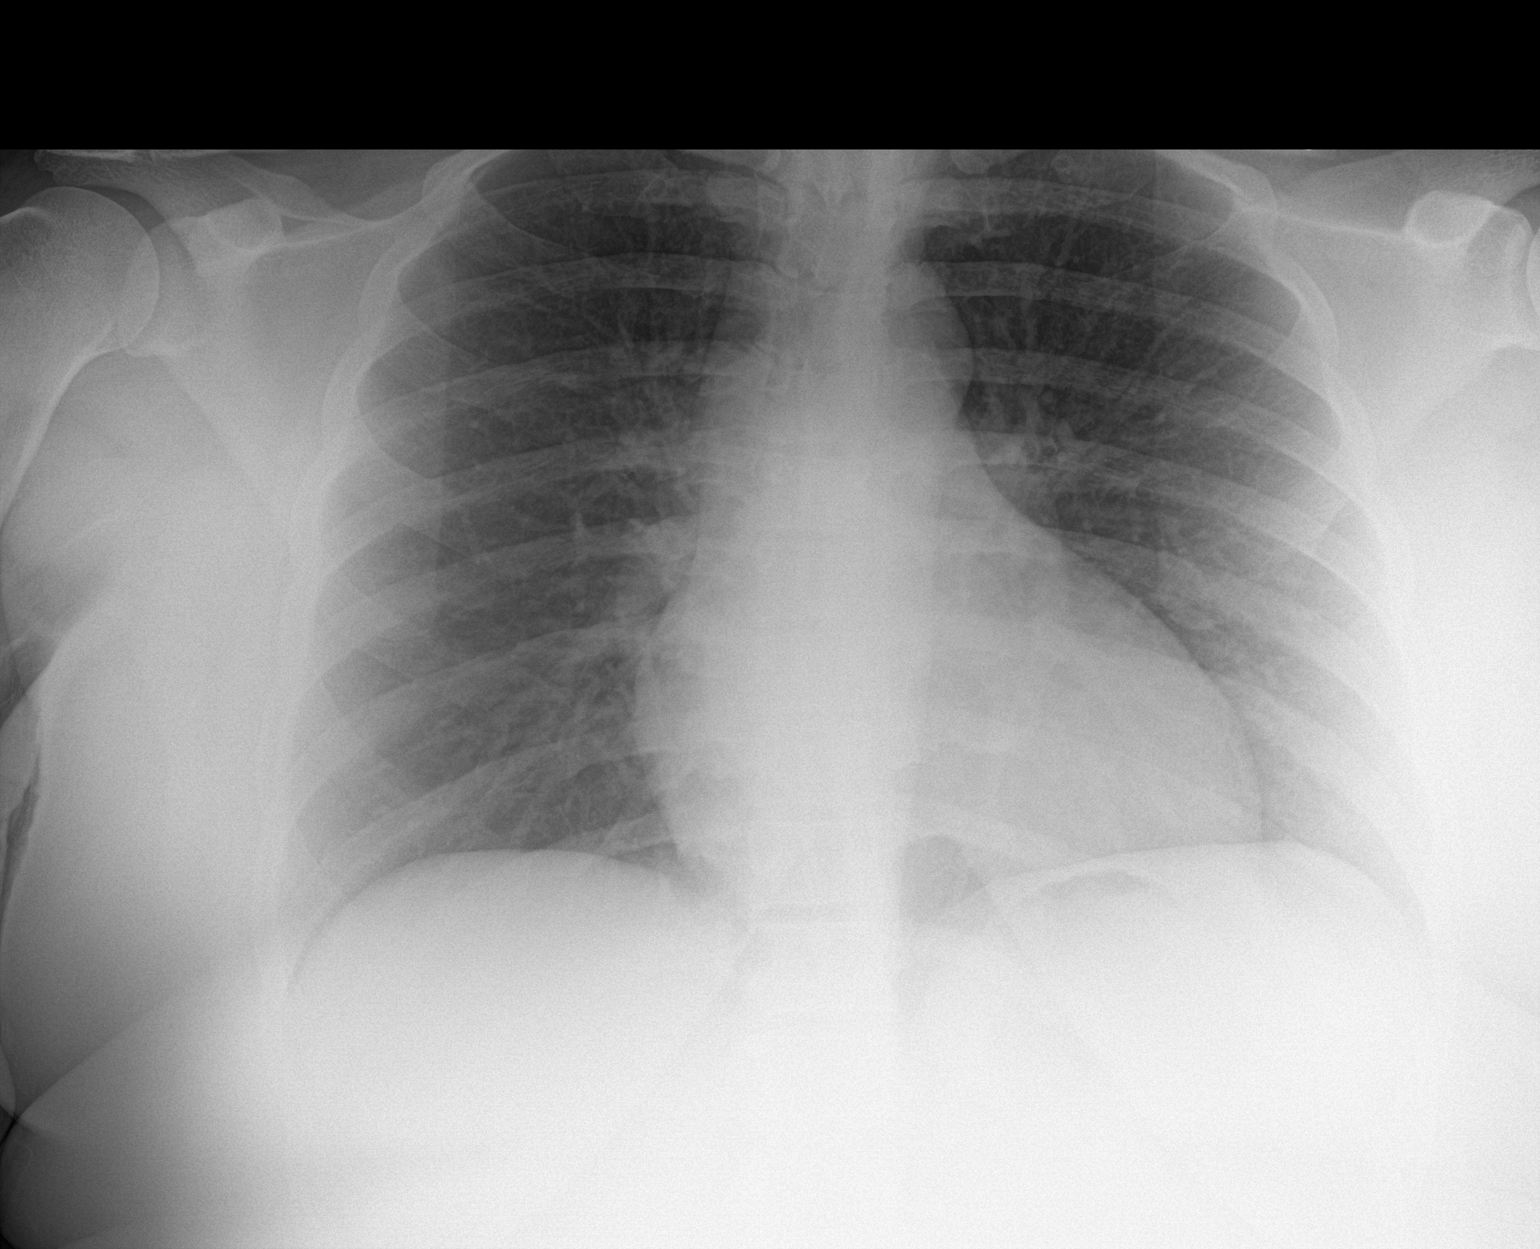

[1 of 1 positions shown; findings below may reference images not displayed]

FINDINGS: Portable AP semi upright view at 2239 hours. Lung volumes and
mediastinal contours are within normal limits. Visualized tracheal
air column is within normal limits. Allowing for portable technique
the lungs are clear. No osseous abnormality identified. Paucity of
bowel gas in the upper abdomen.
IMPRESSION: Negative portable chest

## 2020-03-26 ENCOUNTER — Inpatient Hospital Stay (HOSPITAL_COMMUNITY)
Admission: AD | Admit: 2020-03-26 | Discharge: 2020-03-26 | Disposition: A | Discharge status: Home / Self Care | Payer: 59 | Attending: Obstetrics and Gynecology | Admitting: Obstetrics and Gynecology

## 2020-03-26 ENCOUNTER — Telehealth: Payer: Self-pay | Admitting: Student

## 2020-03-26 ENCOUNTER — Other Ambulatory Visit: Payer: Self-pay

## 2020-03-26 ENCOUNTER — Encounter (HOSPITAL_COMMUNITY): Payer: Self-pay | Admitting: Obstetrics and Gynecology

## 2020-03-26 DIAGNOSIS — Z3202 Encounter for pregnancy test, result negative: Secondary | ICD-10-CM | POA: Diagnosis not present

## 2020-03-26 LAB — POCT PREGNANCY, URINE: Preg Test, Ur: NEGATIVE

## 2020-03-26 LAB — HCG, QUANTITATIVE, PREGNANCY: hCG, Beta Chain, Quant, S: <1 m[IU]/mL (ref <5)

## 2020-03-26 NOTE — Telephone Encounter (Signed)
Called patient and informed her of her bHCG less than 1. Explained that this value means she is not pregnant. She would like to have her tubes ligated; will send message to [name] to schedule patient for surgical consult. Explained that this would be a permanent procedure with no guarantee of reversal in the future. Patient verbalized understanding, all questions answered.   Lisa Irwin

## 2020-03-26 NOTE — MAU Note (Signed)
Pt reports to mau reporting headache, blurry vision,and lower right sided pain since yesterday.  Pt states she purchased an at home covid test as well as hpt.  States covid was neg, but had 1 faint positive preg test and 1 neg preg test.  Pt states her lmp was 03/05/20 and that she took an emergency contraception pill on 03/16/20.  Pt reports she has had both doses of covid vaccine

## 2020-03-26 NOTE — MAU Provider Note (Signed)
Patient Lisa Irwin is a 35 y.o. at Unknown here with complaints of blurry vision, diarrhea, HA. She vaginal bleeding, vaginal pain, abdominal pain. She denies She is vaccinated against COVID -19 but not boosted.  She also reports lots of hot flashes.   Last intercourse was 1/26; LMP 1/15. She also took Plan B on the 26. She reports that her periods are regular.   She thinks she has IBS; remarks that her "inflammatory markers" have been elevated.  Patient Vitals for the past 24 hrs:  BP Temp Temp src Pulse Resp SpO2  03/26/20 1424 131/82 98.3 F (36.8 C) Oral 70 17 100 %    -Pregnancy test here is negative, will draw quant. Confirmed patient's phone number in chart and will call in two hours.    Charlesetta Garibaldi Blanca Carreon 03/26/2020, 2:44 PM

## 2020-03-28 ENCOUNTER — Telehealth: Payer: Self-pay | Admitting: Obstetrics and Gynecology

## 2020-03-28 NOTE — Telephone Encounter (Signed)
LVM for pt to call back to sch Surgical consult for tubal per KK.

## 2020-05-13 ENCOUNTER — Inpatient Hospital Stay (HOSPITAL_COMMUNITY)
Admission: AD | Admit: 2020-05-13 | Discharge: 2020-05-13 | Disposition: A | Discharge status: Home / Self Care | Payer: 59 | Attending: Obstetrics and Gynecology | Admitting: Obstetrics and Gynecology

## 2020-05-13 ENCOUNTER — Other Ambulatory Visit: Payer: Self-pay

## 2020-05-13 ENCOUNTER — Other Ambulatory Visit (HOSPITAL_COMMUNITY): Payer: Self-pay | Admitting: Advanced Practice Midwife

## 2020-05-13 ENCOUNTER — Inpatient Hospital Stay (HOSPITAL_COMMUNITY): Payer: 59

## 2020-05-13 ENCOUNTER — Encounter (HOSPITAL_COMMUNITY): Payer: Self-pay | Admitting: Obstetrics and Gynecology

## 2020-05-13 DIAGNOSIS — Z87891 Personal history of nicotine dependence: Secondary | ICD-10-CM | POA: Diagnosis not present

## 2020-05-13 DIAGNOSIS — O3680X0 Pregnancy with inconclusive fetal viability, not applicable or unspecified: Secondary | ICD-10-CM

## 2020-05-13 DIAGNOSIS — O98811 Other maternal infectious and parasitic diseases complicating pregnancy, first trimester: Secondary | ICD-10-CM | POA: Insufficient documentation

## 2020-05-13 DIAGNOSIS — B373 Candidiasis of vulva and vagina: Secondary | ICD-10-CM | POA: Insufficient documentation

## 2020-05-13 DIAGNOSIS — Z3A01 Less than 8 weeks gestation of pregnancy: Secondary | ICD-10-CM

## 2020-05-13 DIAGNOSIS — N898 Other specified noninflammatory disorders of vagina: Secondary | ICD-10-CM

## 2020-05-13 DIAGNOSIS — O23591 Infection of other part of genital tract in pregnancy, first trimester: Secondary | ICD-10-CM

## 2020-05-13 DIAGNOSIS — B9689 Other specified bacterial agents as the cause of diseases classified elsewhere: Secondary | ICD-10-CM

## 2020-05-13 DIAGNOSIS — O99891 Other specified diseases and conditions complicating pregnancy: Secondary | ICD-10-CM | POA: Insufficient documentation

## 2020-05-13 DIAGNOSIS — M549 Dorsalgia, unspecified: Secondary | ICD-10-CM | POA: Diagnosis present

## 2020-05-13 DIAGNOSIS — O26891 Other specified pregnancy related conditions, first trimester: Secondary | ICD-10-CM | POA: Diagnosis not present

## 2020-05-13 DIAGNOSIS — R102 Pelvic and perineal pain: Secondary | ICD-10-CM | POA: Diagnosis not present

## 2020-05-13 LAB — URINALYSIS, ROUTINE W REFLEX MICROSCOPIC
Bacteria, UA: NONE SEEN
Bilirubin Urine: NEGATIVE
Glucose, UA: NEGATIVE mg/dL
Hgb urine dipstick: NEGATIVE
Ketones, ur: NEGATIVE mg/dL
Nitrite: NEGATIVE
Protein, ur: NEGATIVE mg/dL
Specific Gravity, Urine: 1.019 (ref 1.005–1.030)
pH: 5 (ref 5.0–8.0)

## 2020-05-13 LAB — CBC
HCT: 38.1 % (ref 36.0–46.0)
Hemoglobin: 12.2 g/dL (ref 12.0–15.0)
MCH: 26.2 pg (ref 26.0–34.0)
MCHC: 32 g/dL (ref 30.0–36.0)
MCV: 81.8 fL (ref 80.0–100.0)
Platelets: 268 10*3/uL (ref 150–400)
RBC: 4.66 MIL/uL (ref 3.87–5.11)
RDW: 15.5 % (ref 11.5–15.5)
WBC: 7.5 10*3/uL (ref 4.0–10.5)
nRBC: 0 % (ref 0.0–0.2)

## 2020-05-13 LAB — ABO/RH: ABO/RH(D): B POS

## 2020-05-13 LAB — WET PREP, GENITAL
Sperm: NONE SEEN
Trich, Wet Prep: NONE SEEN
Yeast Wet Prep HPF POC: NONE SEEN

## 2020-05-13 LAB — POCT PREGNANCY, URINE: Preg Test, Ur: POSITIVE — AB

## 2020-05-13 LAB — HCG, QUANTITATIVE, PREGNANCY: hCG, Beta Chain, Quant, S: 50052 m[IU]/mL — ABNORMAL HIGH (ref <5)

## 2020-05-13 MED ORDER — METRONIDAZOLE 500 MG PO TABS: 500.0000 mg | ORAL_TABLET | Freq: Two times a day (BID) | ORAL | 0 refills | Status: AC

## 2020-05-13 MED ORDER — TERCONAZOLE 0.4 % VA CREA: 1.0000 | TOPICAL_CREAM | Freq: Every day | VAGINAL | 0 refills | Status: AC

## 2020-05-13 MED FILL — TERCONAZOLE 0.4% VAG CREAM: 0.4 | 7 days supply | Qty: 45 | Fill #0

## 2020-05-13 MED FILL — metroNIDAZOLE 500 MG TABS: 500 | 7 days supply | Qty: 14 | Fill #0

## 2020-05-13 NOTE — MAU Note (Addendum)
Lisa Irwin is a 35 y.o. at [redacted]w[redacted]d here in MAU reporting: vaginal discomfort, burning, and itching since the beginning of the week. States having a small amount of discharge. Also having some back pain. No bleeding.  LMP: 03/31/20  Onset of complaint: ongoing  Pain score: 6/10  Vitals:   05/13/20 0728  BP: 124/84  Pulse: 73  Resp: 16  Temp: 98.1 F (36.7 C)  SpO2: 99%     Lab orders placed from triage: UA, UPT

## 2020-05-13 NOTE — Discharge Instructions (Signed)
Some natural remedies/prevention to try for bacterial vaginosis: --Take a probiotic tablet/capsule every day for at least 1-2 months.   --Whenever you have symptoms, use boric acid suppositories or tampons with coconut oil and 2-3 drops of tea tree oil vaginally every night for a week.   --Do not use scented soaps/perfumes in the vaginal area, and do not overwash multiple times daily. --Wear breathable cotton underwear and do not wear tight restrictive clothing. --Limit pantyliner use, change your underwear several times daily instead. --Use condoms during intercourse.  

## 2020-05-13 NOTE — MAU Provider Note (Signed)
Chief Complaint: Vaginal Discharge and Back Pain   Event Date/Time   First Provider Initiated Contact with Patient 05/13/20 0925     Vag itching, hx bv, some back abdom pain Works needs to come to [employer] sun instead of offic emonday   SUBJECTIVE HPI: Lisa Irwin is a 35 y.o. [phone] at [redacted]w[redacted]d by LMP who presents to maternity admissions reporting vaginal discharge with itching and mild lower abdominal cramping. She reports history of bacterial vaginosis but does not usually have itching. There are no other s/sx and she has not tried any treatments.  Location: lower abdomen Quality: cramping Severity: 2/10 on pain scale Duration: 1 day Timing: intermittent Modifying factors: none Associated signs and symptoms: vaginal discharge/itching  HPI  History reviewed. No pertinent past medical history. History reviewed. No pertinent surgical history. Social History   Socioeconomic History  . Marital status: Single    Spouse name: Not on file  . Number of children: Not on file  . Years of education: Not on file  . Highest education level: Not on file  Occupational History  . Not on file  Tobacco Use  . Smoking status: Former Games developer  . Smokeless tobacco: Never Used  Vaping Use  . Vaping Use: Never used  Substance and Sexual Activity  . Alcohol use: Not Currently  . Drug use: Never  . Sexual activity: Yes  Other Topics Concern  . Not on file  Social History Narrative  . Not on file   Social Determinants of Health   Financial Resource Strain: Not on file  Food Insecurity: Not on file  Transportation Needs: Not on file  Physical Activity: Not on file  Stress: Not on file  Social Connections: Not on file  Intimate Partner Violence: Not on file   No current facility-administered medications on file prior to encounter.   Current Outpatient Medications on File Prior to Encounter  Medication Sig Dispense Refill  . erythromycin ophthalmic ointment Place a 1/2 inch ribbon of  ointment into the lower eyelid 6 times a day. 1 g 0   No Known Allergies  ROS:  Review of Systems   I have reviewed patient's Past Medical Hx, Surgical Hx, Family Hx, Social Hx, medications and allergies.   Physical Exam   Patient Vitals for the past 24 hrs:  BP Temp Temp src Pulse Resp SpO2 Height Weight  05/13/20 1004 116/78 - - 72 - 100 % - -  05/13/20 0746 123/78 - - 76 - 99 % - -  05/13/20 0728 124/84 98.1 F (36.7 C) Oral 73 16 99 % 5\' 9"  (1.753 m) (!) 136.8 kg   Constitutional: Well-developed, well-nourished female in no acute distress.  Cardiovascular: normal rate Respiratory: normal effort GI: Abd soft, non-tender. Pos BS x 4 MS: Extremities nontender, no edema, normal ROM Neurologic: Alert and oriented x 4.  GU: Neg CVAT.  PELVIC EXAM: wet prep/GCC collected by blind swab   LAB RESULTS Results for orders placed or performed during the hospital encounter of 05/13/20 (from the past 24 hour(s))  Urinalysis, Routine w reflex microscopic     Status: Abnormal   Collection Time: 05/13/20  7:20 AM  Result Value Ref Range   Color, Urine YELLOW YELLOW   APPearance CLEAR CLEAR   Specific Gravity, Urine 1.019 1.005 - 1.030   pH 5.0 5.0 - 8.0   Glucose, UA NEGATIVE NEGATIVE mg/dL   Hgb urine dipstick NEGATIVE NEGATIVE   Bilirubin Urine NEGATIVE NEGATIVE   Ketones, ur NEGATIVE NEGATIVE  mg/dL   Protein, ur NEGATIVE NEGATIVE mg/dL   Nitrite NEGATIVE NEGATIVE   Leukocytes,Ua TRACE (A) NEGATIVE   RBC / HPF 0-5 0 - 5 RBC/hpf   WBC, UA 0-5 0 - 5 WBC/hpf   Bacteria, UA NONE SEEN NONE SEEN   Squamous Epithelial / LPF 0-5 0 - 5   Mucus PRESENT    Amorphous Crystal PRESENT   Pregnancy, urine POC     Status: Abnormal   Collection Time: 05/13/20  7:22 AM  Result Value Ref Range   Preg Test, Ur POSITIVE (A) NEGATIVE  Wet prep, genital     Status: Abnormal   Collection Time: 05/13/20  7:45 AM   Specimen: Vaginal  Result Value Ref Range   Yeast Wet Prep HPF POC NONE SEEN NONE  SEEN   Trich, Wet Prep NONE SEEN NONE SEEN   Clue Cells Wet Prep HPF POC PRESENT (A) NONE SEEN   WBC, Wet Prep HPF POC MODERATE (A) NONE SEEN   Sperm NONE SEEN   CBC     Status: None   Collection Time: 05/13/20  8:02 AM  Result Value Ref Range   WBC 7.5 4.0 - 10.5 K/uL   RBC 4.66 3.87 - 5.11 MIL/uL   Hemoglobin 12.2 12.0 - 15.0 g/dL   HCT 21.1 94.1 - 74.0 %   MCV 81.8 80.0 - 100.0 fL   MCH 26.2 26.0 - 34.0 pg   MCHC 32.0 30.0 - 36.0 g/dL   RDW 81.4 48.1 - 85.6 %   Platelets 268 150 - 400 K/uL   nRBC 0.0 0.0 - 0.2 %  hCG, quantitative, pregnancy     Status: Abnormal   Collection Time: 05/13/20  8:02 AM  Result Value Ref Range   hCG, Beta Chain, Quant, S 50,052 (H) <5 mIU/mL  ABO/Rh     Status: None   Collection Time: 05/13/20  8:02 AM  Result Value Ref Range   ABO/RH(D) B POS    No rh immune globuloin      NOT A RH IMMUNE GLOBULIN CANDIDATE, PT RH POSITIVE Performed at [facility], [address] [place], [place] [address]     --/--/B POS (03/25 0802)  IMAGING US OB LESS THAN 14 WEEKS WITH OB TRANSVAGINAL  Result Date: 05/13/2020 CLINICAL DATA:  Pelvic pain. EXAM: OBSTETRIC <14 WK [place] AND TRANSVAGINAL OB US TECHNIQUE: Both transabdominal and transvaginal ultrasound examinations were performed for complete evaluation of the gestation as well as the maternal uterus, adnexal regions, and pelvic cul-de-sac. Transvaginal technique was performed to assess early pregnancy. COMPARISON:  None. FINDINGS: Intrauterine gestational sac: Single Yolk sac:  Not Visualized. Embryo:  Not Visualized. Cardiac Activity: Not Visualized. MSD: 19.2 mm   6 w   6 d Subchorionic hemorrhage:  None visualized. Maternal uterus/adnexae: Corpus luteum cyst noted in left ovary. Right ovary is unremarkable. No free fluid is noted. IMPRESSION: Probable early intrauterine gestational sac, but no yolk sac, fetal pole, or cardiac activity yet visualized. Recommend follow-up quantitative B-HCG levels and  follow-up US in 14 days to assess viability. This recommendation follows SRU consensus guidelines: Diagnostic Criteria for Nonviable Pregnancy Early in the First Trimester. Malva Limes Med 2013; 026:3785-88. Electronically Signed   By: Lupita Raider M.D.   On: 05/13/2020 08:57    MAU Management/MDM: Orders Placed This Encounter  Procedures  . Wet prep, genital  . Culture, OB Urine  . US OB LESS THAN 14 WEEKS WITH OB TRANSVAGINAL  . Urinalysis,  Routine w reflex microscopic Urine, Clean Catch  . CBC  . hCG, quantitative, pregnancy  . Pregnancy, urine POC  . ABO/Rh  . Discharge patient    Meds ordered this encounter  Medications  . metroNIDAZOLE (FLAGYL) 500 MG tablet    Sig: Take 1 tablet (500 mg total) by mouth 2 (two) times daily.    Dispense:  14 tablet    Refill:  0    Order Specific Question:   Supervising Provider    Answer:   CONSTANT, PEGGY [[id number]]  . terconazole (TERAZOL 7) 0.4 % vaginal cream    Sig: Place 1 applicator vaginally at bedtime.    Dispense:  45 g    Refill:  0    Order Specific Question:   Supervising Provider    Answer:   CONSTANT, PEGGY [[id number]]    Findings today could represent a normal early pregnancy, spontaneous abortion or ectopic pregnancy which can be life-threatening.  Ectopic precautions were given to the patient with plan to return in 48 hours for repeat quant hcg to evaluate pregnancy development. Will treat BV with clue cells on wet prep, hx of BV and vaginal irritation, but concerned about likely yeast with itching.  Will send Rx for Flagyl for BV but also for Terazol for yeast. Pt to f/u if symptoms persist. Pt is unable to do labwork on Monday so will return to MAU on Sunday after work for stat hcg.  Return sooner for emergencies.   ASSESSMENT 1. Bacterial vaginosis in pregnancy   2. Pregnancy of unknown anatomic location   3. Pelvic pain affecting pregnancy in first trimester, antepartum   4. Vaginal itching     PLAN Discharge  home Allergies as of 05/13/2020   No Known Allergies     Medication List    STOP taking these medications   ibuprofen 400 MG tablet Commonly known as: ADVIL   meloxicam 7.5 MG tablet Commonly known as: Mobic   methocarbamol 500 MG tablet Commonly known as: ROBAXIN     TAKE these medications   erythromycin ophthalmic ointment Place a 1/2 inch ribbon of ointment into the lower eyelid 6 times a day.   metroNIDAZOLE 500 MG tablet Commonly known as: Flagyl Take 1 tablet (500 mg total) by mouth 2 (two) times daily.   terconazole 0.4 % vaginal cream Commonly known as: Terazol 7 Place 1 applicator vaginally at bedtime.       Follow-up Information    [facility] Follow up.   Specialty: Obstetrics and Gynecology Why: Return on Sunday, 05/15/20 for repeat hcg or sooner as needed for emergencies. Contact information: [address] [id number] mc [place] [address] [phone]              Sharen Counter Certified Nurse-Midwife 05/13/2020  5:36 PM

## 2020-05-13 NOTE — MAU Note (Signed)
Called lab to check on the status of the patients hCG and CBC as it has been an hour since they were sent. Lab stated "I have them right here, I'm about to run them now."

## 2020-05-14 LAB — CULTURE, OB URINE: Culture: NO GROWTH

## 2020-05-15 ENCOUNTER — Inpatient Hospital Stay (HOSPITAL_COMMUNITY)
Admission: AD | Admit: 2020-05-15 | Discharge: 2020-05-15 | Disposition: A | Discharge status: Home / Self Care | Payer: 59 | Attending: Obstetrics & Gynecology | Admitting: Obstetrics & Gynecology

## 2020-05-15 ENCOUNTER — Other Ambulatory Visit: Payer: Self-pay

## 2020-05-15 ENCOUNTER — Inpatient Hospital Stay (HOSPITAL_COMMUNITY): Payer: 59

## 2020-05-15 DIAGNOSIS — Z3491 Encounter for supervision of normal pregnancy, unspecified, first trimester: Secondary | ICD-10-CM | POA: Diagnosis not present

## 2020-05-15 DIAGNOSIS — O26891 Other specified pregnancy related conditions, first trimester: Secondary | ICD-10-CM | POA: Insufficient documentation

## 2020-05-15 DIAGNOSIS — Z3A01 Less than 8 weeks gestation of pregnancy: Secondary | ICD-10-CM | POA: Diagnosis not present

## 2020-05-15 DIAGNOSIS — Z3689 Encounter for other specified antenatal screening: Secondary | ICD-10-CM | POA: Diagnosis not present

## 2020-05-15 DIAGNOSIS — R109 Unspecified abdominal pain: Secondary | ICD-10-CM | POA: Insufficient documentation

## 2020-05-15 LAB — HCG, QUANTITATIVE, PREGNANCY: hCG, Beta Chain, Quant, S: 83219 m[IU]/mL — ABNORMAL HIGH (ref <5)

## 2020-05-15 NOTE — MAU Note (Signed)
Pt back for HCG redraw. States she has been having lower, left abd pain and lower back pain, 4/10. Denies VB.

## 2020-05-15 NOTE — Discharge Instructions (Signed)
Obstetrics: Normal and Problem Pregnancies (7th ed., pp. 102-121). [place], [place]: Elsevier."> Textbook of Family Medicine (9th ed., pp. 365-410). [place], [place]: Elsevier [employer].">  First Trimester of Pregnancy  The first trimester of pregnancy starts on the first day of your last menstrual period until the end of week 12. This is months 1 through 3 of pregnancy. A week after a sperm fertilizes an egg, the egg will implant into the wall of the uterus and begin to develop into a baby. By the end of 12 weeks, all the baby's organs will be formed and the baby will be 2-3 inches in size. Body changes during your first trimester Your body goes through many changes during pregnancy. The changes vary and generally return to normal after your baby is born. Physical changes  You may gain or lose weight.  Your breasts may begin to grow larger and become tender. The tissue that surrounds your nipples (areola) may become darker.  Dark spots or blotches (chloasma or mask of pregnancy) may develop on your face.  You may have changes in your hair. These can include thickening or thinning of your hair or changes in texture. Health changes  You may feel nauseous, and you may vomit.  You may have heartburn.  You may develop headaches.  You may develop constipation.  Your gums may bleed and may be sensitive to brushing and flossing. Other changes  You may tire easily.  You may urinate more often.  Your menstrual periods will stop.  You may have a loss of appetite.  You may develop cravings for certain kinds of food.  You may have changes in your emotions from day to day.  You may have more vivid and strange dreams. Follow these instructions at home: Medicines  Follow your health care provider's instructions regarding medicine use. Specific medicines may be either safe or unsafe to take during pregnancy. Do not take any medicines unless told to by your health care provider.  Take a  prenatal vitamin that contains at least 600 micrograms (mcg) of folic acid. Eating and drinking  Eat a healthy diet that includes fresh fruits and vegetables, whole grains, good sources of protein such as meat, eggs, or tofu, and low-fat dairy products.  Avoid raw meat and unpasteurized juice, milk, and cheese. These carry germs that can harm you and your baby.  If you feel nauseous or you vomit: ? Eat 4 or 5 small meals a day instead of 3 large meals. ? Try eating a few soda crackers. ? Drink liquids between meals instead of during meals.  You may need to take these actions to prevent or treat constipation: ? Drink enough fluid to keep your urine pale yellow. ? Eat foods that are high in fiber, such as beans, whole grains, and fresh fruits and vegetables. ? Limit foods that are high in fat and processed sugars, such as fried or sweet foods. Activity  Exercise only as directed by your health care provider. Most people can continue their usual exercise routine during pregnancy. Try to exercise for 30 minutes at least 5 days a week.  Stop exercising if you develop pain or cramping in the lower abdomen or lower back.  Avoid exercising if it is very hot or humid or if you are at high altitude.  Avoid heavy lifting.  If you choose to, you may have sex unless your health care provider tells you not to. Relieving pain and discomfort  Wear a good support bra to relieve breast   tenderness.  Rest with your legs elevated if you have leg cramps or low back pain.  If you develop bulging veins (varicose veins) in your legs: ? Wear support hose as told by your health care provider. ? Elevate your feet for 15 minutes, 3-4 times a day. ? Limit salt in your diet. Safety  Wear your seat belt at all times when driving or riding in a car.  Talk with your health care provider if someone is verbally or physically abusive to you.  Talk with your health care provider if you are feeling sad or have  thoughts of hurting yourself. Lifestyle  Do not use hot tubs, steam rooms, or saunas.  Do not douche. Do not use tampons or scented sanitary pads.  Do not use herbal remedies, alcohol, illegal drugs, or medicines that are not approved by your health care provider. Chemicals in these products can harm your baby.  Do not use any products that contain nicotine or tobacco, such as cigarettes, e-cigarettes, and chewing tobacco. If you need help quitting, ask your health care provider.  Avoid cat litter boxes and soil used by cats. These carry germs that can cause birth defects in the baby and possibly loss of the unborn baby (fetus) by miscarriage or stillbirth. General instructions  During routine prenatal visits in the first trimester, your health care provider will do a physical exam, perform necessary tests, and ask you how things are going. Keep all follow-up visits. This is important.  Ask for help if you have counseling or nutritional needs during pregnancy. Your health care provider can offer advice or refer you to specialists for help with various needs.  Schedule a dentist appointment. At home, brush your teeth with a soft toothbrush. Floss gently.  Write down your questions. Take them to your prenatal visits. Where to find more information  American Pregnancy Association: americanpregnancy.org  American College of Obstetricians and Gynecologists: acog.org/en/Womens%20Health/Pregnancy  Office on Women's Health: womenshealth.gov/pregnancy Contact a health care provider if you have:  Dizziness.  A fever.  Mild pelvic cramps, pelvic pressure, or nagging pain in the abdominal area.  Nausea, vomiting, or diarrhea that lasts for 24 hours or longer.  A bad-smelling vaginal discharge.  Pain when you urinate.  Known exposure to a contagious illness, such as chickenpox, measles, Zika virus, HIV, or hepatitis. Get help right away if you have:  Spotting or bleeding from your  vagina.  Severe abdominal cramping or pain.  Shortness of breath or chest pain.  Any kind of trauma, such as from a fall or a car crash.  New or increased pain, swelling, or redness in an arm or leg. Summary  The first trimester of pregnancy starts on the first day of your last menstrual period until the end of week 12 (months 1 through 3).  Eating 4 or 5 small meals a day rather than 3 large meals may help to relieve nausea and vomiting.  Do not use any products that contain nicotine or tobacco, such as cigarettes, e-cigarettes, and chewing tobacco. If you need help quitting, ask your health care provider.  Keep all follow-up visits. This is important. This information is not intended to replace advice given to you by your health care provider. Make sure you discuss any questions you have with your health care provider. Document Revised: 07/15/2019 Document Reviewed: 05/21/2019 Elsevier Patient Education  2021 Elsevier Inc.  Safe Medications in Pregnancy   Acne: Benzoyl Peroxide Salicylic Acid  Backache/Headache: Tylenol: 2 regular strength every   4 hours OR              2 Extra strength every 6 hours  Colds/Coughs/Allergies: Benadryl (alcohol free) 25 mg every 6 hours as needed Breath right strips Claritin Cepacol throat lozenges Chloraseptic throat spray Cold-Eeze- up to three times per day Cough drops, alcohol free Flonase (by prescription only) Guaifenesin Mucinex Robitussin DM (plain only, alcohol free) Saline nasal spray/drops Sudafed (pseudoephedrine) & Actifed ** use only after [redacted] weeks gestation and if you do not have high blood pressure Tylenol Vicks Vaporub Zinc lozenges Zyrtec   Constipation: Colace Ducolax suppositories Fleet enema Glycerin suppositories Metamucil Milk of magnesia Miralax Senokot Smooth move tea  Diarrhea: Kaopectate Imodium A-D  *NO pepto Bismol  Hemorrhoids: Anusol Anusol HC Preparation  H Tucks  Indigestion: Tums Maalox Mylanta Zantac  Pepcid  Insomnia: Benadryl (alcohol free) 25mg every 6 hours as needed Tylenol PM Unisom, no Gelcaps  Leg Cramps: Tums MagGel  Nausea/Vomiting:  Bonine Dramamine Emetrol Ginger extract Sea bands Meclizine  Nausea medication to take during pregnancy:  Unisom (doxylamine succinate 25 mg tablets) Take one tablet daily at bedtime. If symptoms are not adequately controlled, the dose can be increased to a maximum recommended dose of two tablets daily (1/2 tablet in the morning, 1/2 tablet mid-afternoon and one at bedtime). Vitamin B6 100mg tablets. Take one tablet twice a day (up to 200 mg per day).  Skin Rashes: Aveeno products Benadryl cream or 25mg every 6 hours as needed Calamine Lotion 1% cortisone cream  Yeast infection: Gyne-lotrimin 7 Monistat 7   **If taking multiple medications, please check labels to avoid duplicating the same active ingredients **take medication as directed on the label ** Do not exceed 4000 mg of tylenol in 24 hours **Do not take medications that contain aspirin or ibuprofen      

## 2020-05-15 NOTE — MAU Provider Note (Signed)
History   Chief Complaint:  Back Pain (HCG redraw ) and Abdominal Pain   Lisa Irwin is  35 y.o. [id number] Patient's last menstrual period was 03/31/2020.Marland Kitchen Patient is here for follow up of quantitative HCG and ongoing surveillance of pregnancy status. She is [redacted]w[redacted]d weeks gestation  by LMP.    Since her last visit, the patient is without new complaint. The patient reports bleeding as  none now.  She reports intermittent abdominal and back pain that she rates a 4/10. She has not tried anything for the pain and the pain is not worse than it was on 3/25 when she was seen.   General ROS:  positive for back and abdominal pain  Her previous Quantitative HCG values are:  Results for MARITES, NATH (MRN [id number]) as of 05/15/2020 21:29  Ref. Range 05/13/2020 08:02  HCG, Beta Chain, Quant, S Latest Ref Range: <5 mIU/mL 50,052 (H)   Physical Exam   Blood pressure (!) 149/85, pulse 76, temperature 98.6 F (37 C), temperature source Oral, resp. rate 16, height 5\' 9"  (1.753 m), last menstrual period 03/31/2020, SpO2 100 %.  Focused Gynecological Exam: normal external genitalia, vulva, vagina, cervix, uterus and adnexa, examination not indicated  Physical Exam Constitutional:      General: She is not in acute distress.    Appearance: Normal appearance. She is not ill-appearing.  Cardiovascular:     Rate and Rhythm: Normal rate.  Pulmonary:     Effort: Pulmonary effort is normal. No respiratory distress.  Neurological:     General: No focal deficit present.     Mental Status: She is alert and oriented to person, place, and time.  Skin:    General: Skin is warm and dry.  Psychiatric:        Mood and Affect: Mood normal.        Behavior: Behavior normal.        Thought Content: Thought content normal.        Judgment: Judgment normal.    Labs: Results for orders placed or performed during the hospital encounter of 05/15/20 (from the past 24 hour(s))  hCG, quantitative, pregnancy    Collection Time: 05/15/20  7:32 PM  Result Value Ref Range   hCG, Beta Chain, Quant, S 83,219 (H) <5 mIU/mL    Ultrasound Studies:   05/17/20 OB Transvaginal  Result Date: 05/15/2020 CLINICAL DATA:  Left-sided pelvic pain with positive pregnancy test, follow-up viability EXAM: TRANSVAGINAL OB ULTRASOUND TECHNIQUE: Transvaginal ultrasound was performed for complete evaluation of the gestation as well as the maternal uterus, adnexal regions, and pelvic cul-de-sac. COMPARISON:  05/13/2020 FINDINGS: Intrauterine gestational sac: Present Yolk sac:  Present Embryo:  Present Cardiac Activity: Present Heart Rate: 111 bpm CRL:   3.1 mm mm   5 w 6 d                  05/15/2020 EDC: 01/09/2021 Subchorionic hemorrhage:  None visualized. Maternal uterus/adnexae: Within normal limits. IMPRESSION: Single live intrauterine gestation at 5 weeks 6 days. Follow-up as clinically indicated. Electronically Signed   By: 01/11/2021 M.D.   On: 05/15/2020 21:18   05/17/2020 OB LESS THAN 14 WEEKS WITH OB TRANSVAGINAL  Result Date: 05/13/2020 CLINICAL DATA:  Pelvic pain. EXAM: OBSTETRIC <14 WK 05/15/2020 AND TRANSVAGINAL OB US TECHNIQUE: Both transabdominal and transvaginal ultrasound examinations were performed for complete evaluation of the gestation as well as the maternal uterus, adnexal regions, and pelvic cul-de-sac. Transvaginal technique was performed to assess early  pregnancy. COMPARISON:  None. FINDINGS: Intrauterine gestational sac: Single Yolk sac:  Not Visualized. Embryo:  Not Visualized. Cardiac Activity: Not Visualized. MSD: 19.2 mm   6 w   6 d Subchorionic hemorrhage:  None visualized. Maternal uterus/adnexae: Corpus luteum cyst noted in left ovary. Right ovary is unremarkable. No free fluid is noted. IMPRESSION: Probable early intrauterine gestational sac, but no yolk sac, fetal pole, or cardiac activity yet visualized. Recommend follow-up quantitative B-HCG levels and follow-up US in 14 days to assess viability. This recommendation follows  SRU consensus guidelines: Diagnostic Criteria for Nonviable Pregnancy Early in the First Trimester. Malva Limes Med 2013; 629:5284-13. Electronically Signed   By: Lupita Raider M.D.   On: 05/13/2020 08:57    Assessment:   1. Normal intrauterine pregnancy on prenatal ultrasound in first trimester   2. [redacted] weeks gestation of pregnancy     Plan: -Discharge home in stable condition -First trimester precautions discussed -Patient advised to follow-up with OB in [place] to establish care -Patient may return to MAU as needed or if her condition were to change or worsen  Rolm Bookbinder, CNM 05/15/2020, 9:29 PM

## 2020-05-16 LAB — GC/CHLAMYDIA PROBE AMP (~~LOC~~) NOT AT ARMC
Chlamydia: POSITIVE — AB
Comment: NEGATIVE
Comment: NORMAL
Neisseria Gonorrhea: NEGATIVE

## 2020-05-17 ENCOUNTER — Other Ambulatory Visit: Payer: Self-pay | Admitting: Women's Health

## 2020-05-17 DIAGNOSIS — A749 Chlamydial infection, unspecified: Secondary | ICD-10-CM

## 2020-05-17 MED ORDER — AZITHROMYCIN 500 MG PO TABS: 1000.0000 mg | ORAL_TABLET | Freq: Every day | ORAL | 1 refills | Status: AC

## 2020-05-17 NOTE — Progress Notes (Signed)
Pt called in to discuss positive CT results and obtain treatment. Patient identified by two identifiers. Patient saw +CT results on MyChart and called in to obtain treatment. Patient requests extra prescription for partner and will confirm partner has no allergies before he takes medication. Pt advised that sometimes pharmacies will not dispense EPT, in which case her partner will have to be seen in-person for treatment. Patient confirms NKDA and only other medication currently is metronidazole, which pt reports is making her nauseous. Patient advised to take azithromycin after finishing course of metronidazole to avoid additional nausea. Patient verbalizes understanding and questions asked and answered. Rx azithromycin sent with one refill.  Marylen Ponto, NP  10:35 AM 05/17/2020

## 2021-08-12 IMAGING — US US OB < 14 WEEKS - US OB TV
1 series · 15 of 28 positions shown · non-contrast
Comparison: None.

CLINICAL DATA: Pelvic pain.

EXAM:
OBSTETRIC <14 WK US AND TRANSVAGINAL OB US
TECHNIQUE: Both transabdominal and transvaginal ultrasound examinations were
performed for complete evaluation of the gestation as well as the
maternal uterus, adnexal regions, and pelvic cul-de-sac.
Transvaginal technique was performed to assess early pregnancy.

[Series 1: us ob < 14 weeks - us ob tv · 15 of 63 slices shown]
[im 1/63]
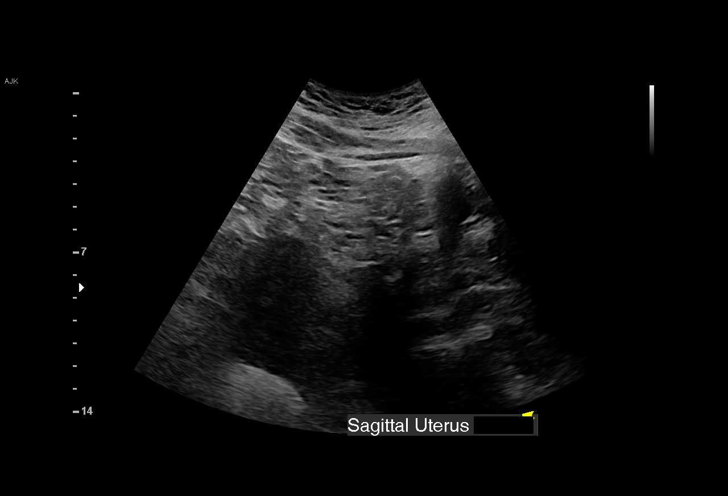
[im 5/63]
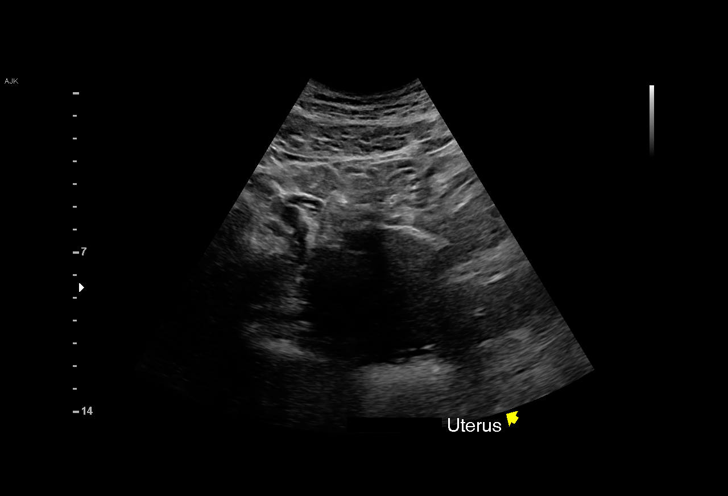
[im 10/63]
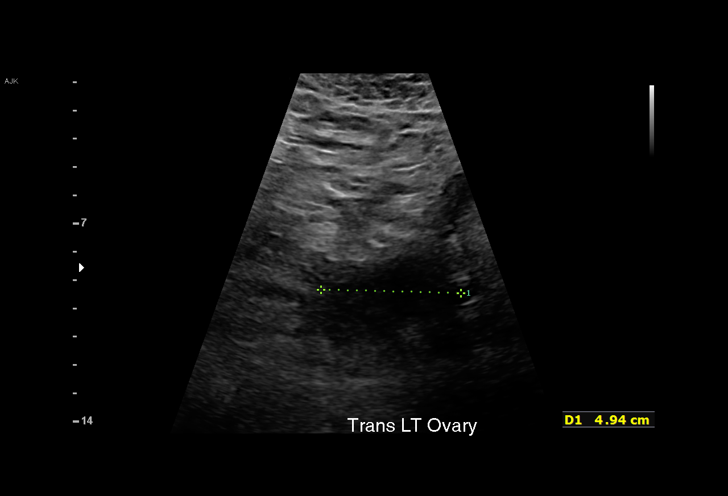
[im 14/63]
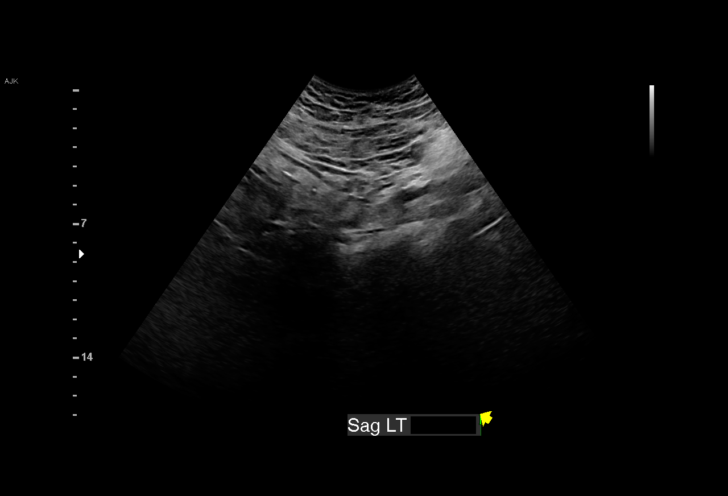
[im 19/63]
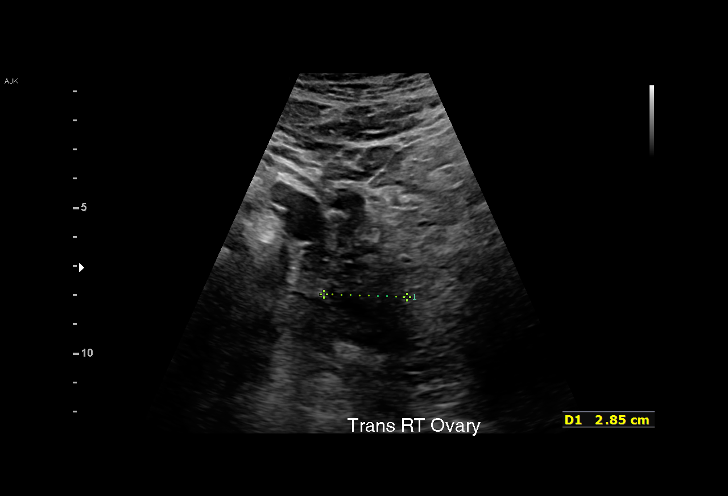
[im 23/63]
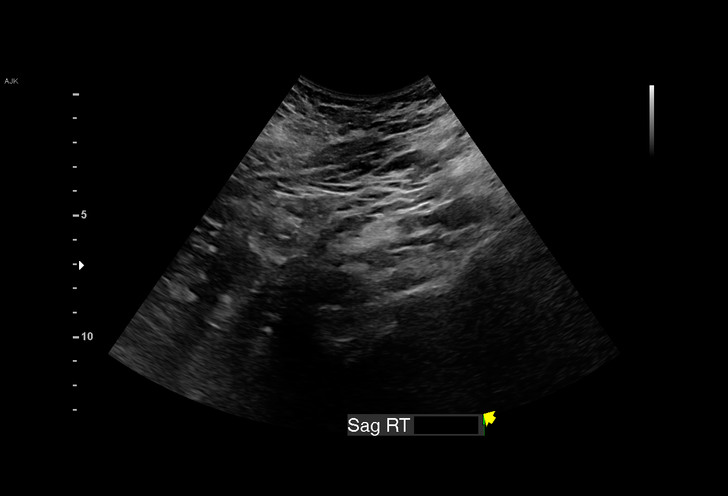
[im 28/63]
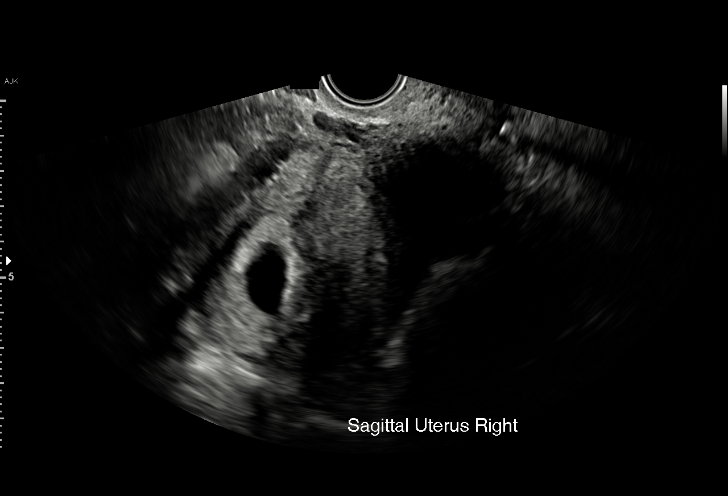
[im 33/63]
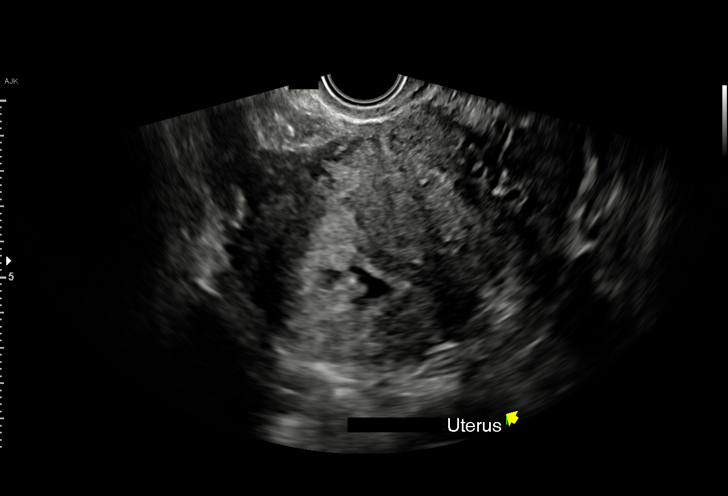
[im 35/63]
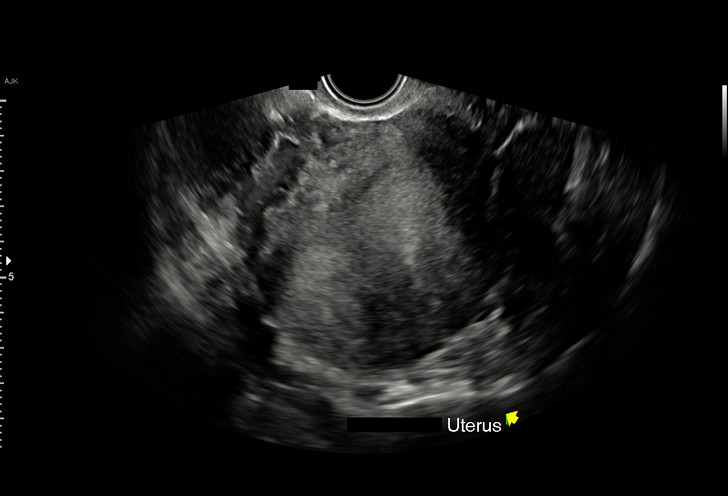
[im 40/63]
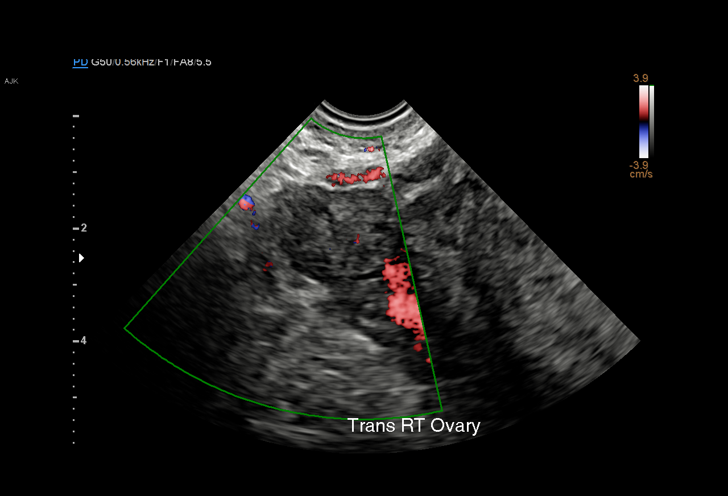
[im 44/63]
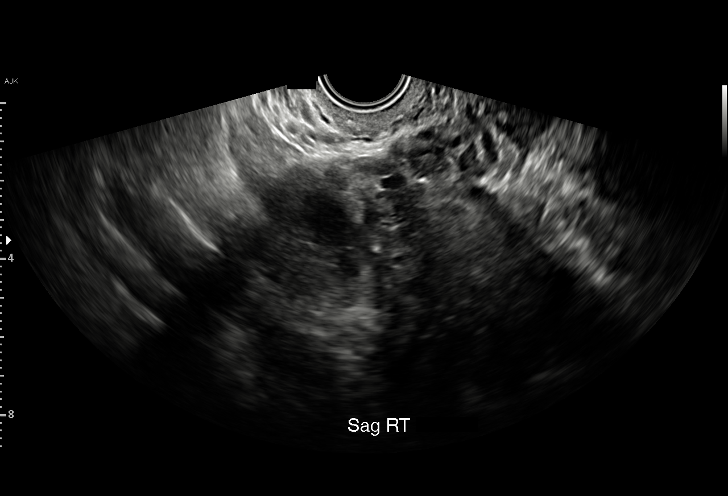
[im 49/63]
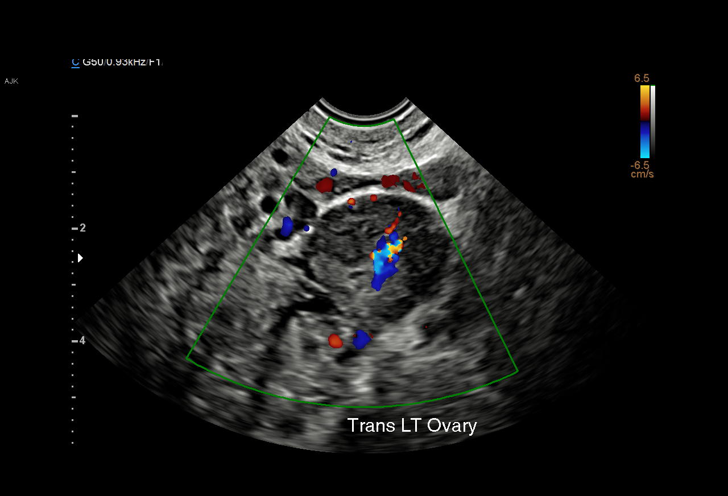
[im 53/63]
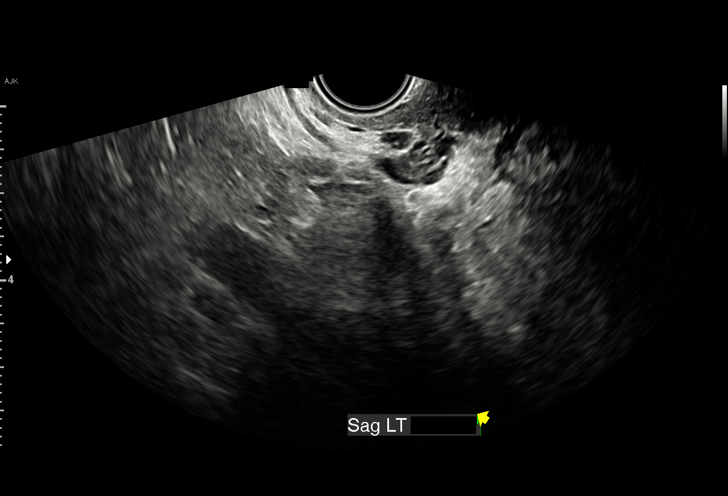
[im 58/63]
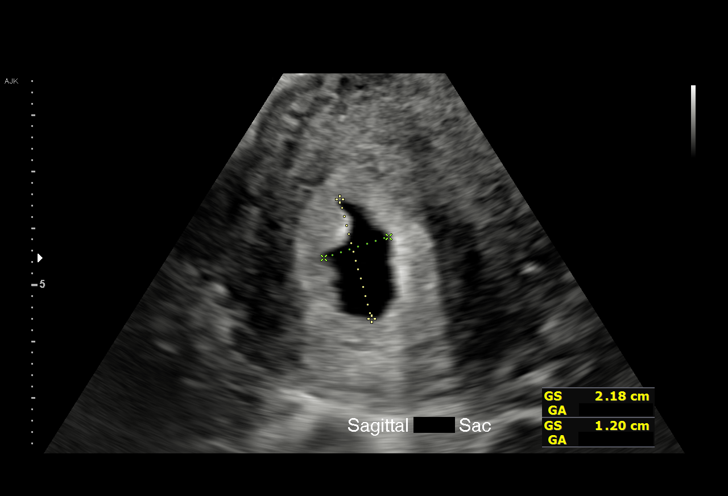
[im 63/63]
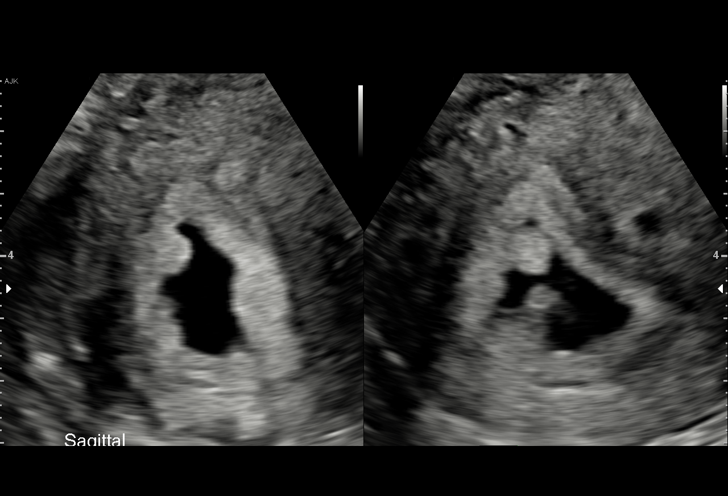

[15 of 28 positions shown; findings below may reference images not displayed]

FINDINGS: Intrauterine gestational sac: Single

Yolk sac:  Not Visualized.

Embryo:  Not Visualized.

Cardiac Activity: Not Visualized.

MSD: 19.2 mm   6 w   6 d

Subchorionic hemorrhage:  None visualized.

Maternal uterus/adnexae: Corpus luteum cyst noted in left ovary.
Right ovary is unremarkable. No free fluid is noted.
IMPRESSION: Probable early intrauterine gestational sac, but no yolk sac, fetal
pole, or cardiac activity yet visualized. Recommend follow-up
quantitative B-HCG levels and follow-up US in 14 days to assess
viability. This recommendation follows [employer] consensus guidelines:
Diagnostic Criteria for Nonviable Pregnancy Early in the First
Trimester. N Engl J Med 7857; [DATE].

## 2021-08-14 IMAGING — US US OB TRANSVAGINAL
1 series · 15 of 24 positions shown · non-contrast
Comparison: 05/13/2020

CLINICAL DATA: Left-sided pelvic pain with positive pregnancy test,
follow-up viability

EXAM:
TRANSVAGINAL OB ULTRASOUND
TECHNIQUE: Transvaginal ultrasound was performed for complete evaluation of the
gestation as well as the maternal uterus, adnexal regions, and
pelvic cul-de-sac.

[Series 1: us ob transvaginal · 24 acquisitions, 15 frames shown]
[im 1/24]
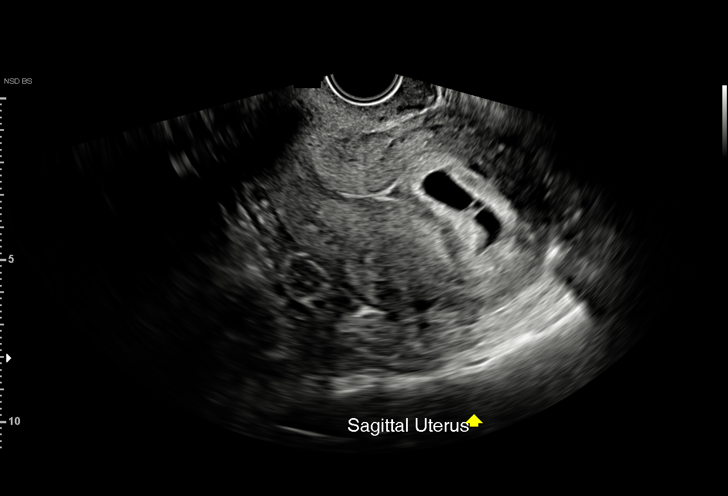
[im 3/24]
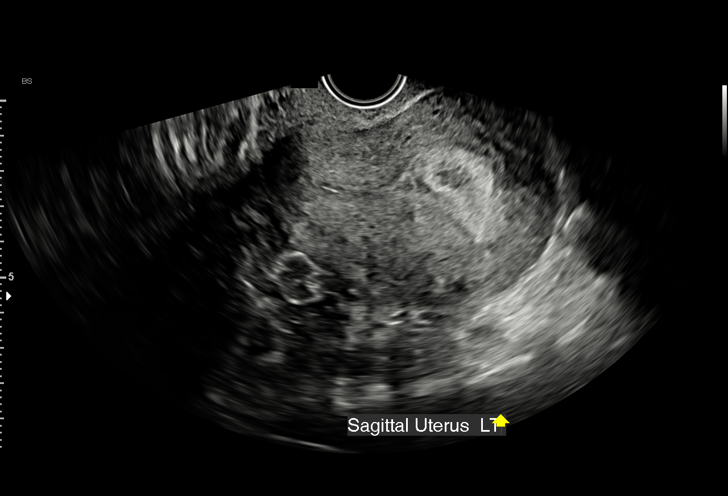
[im 5/24]
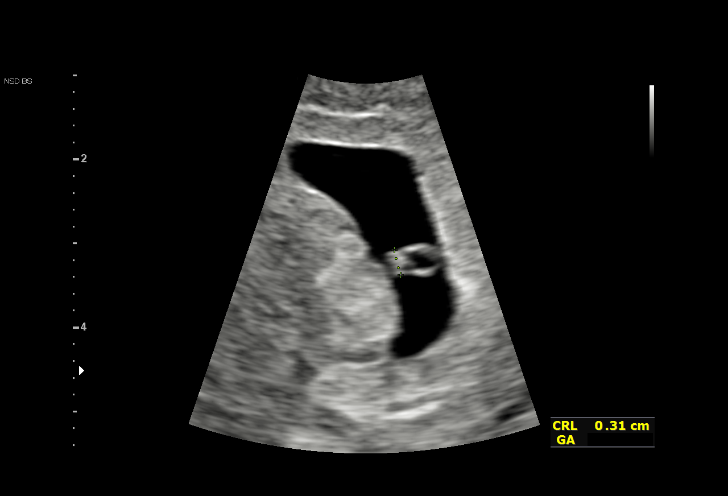
[im 6/24]
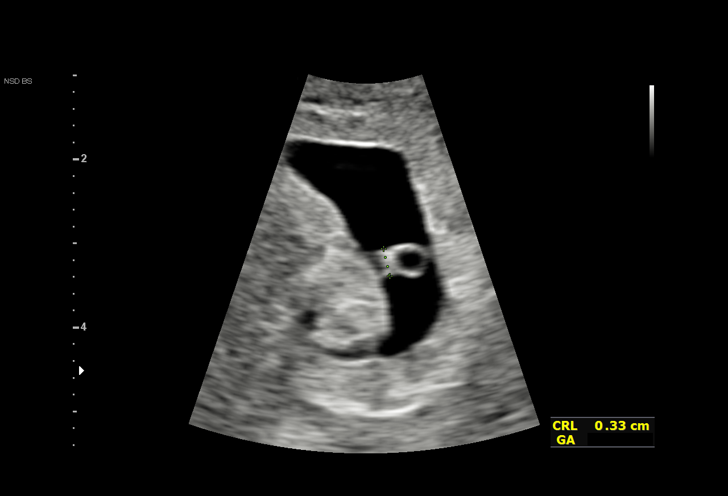
[im 8/24]
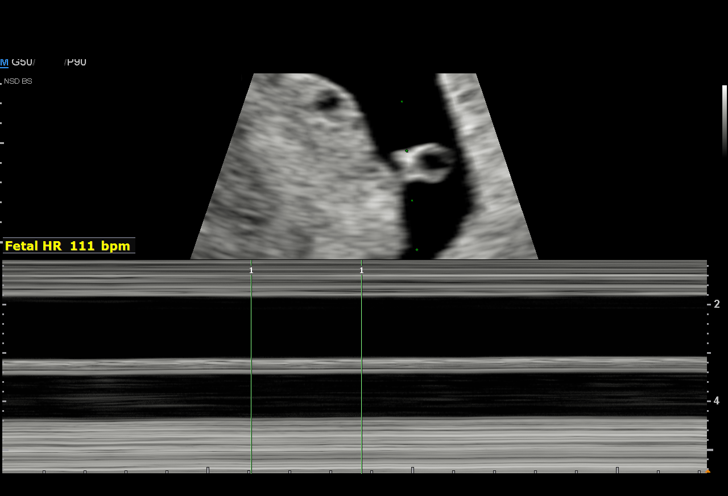
[im 9/24]
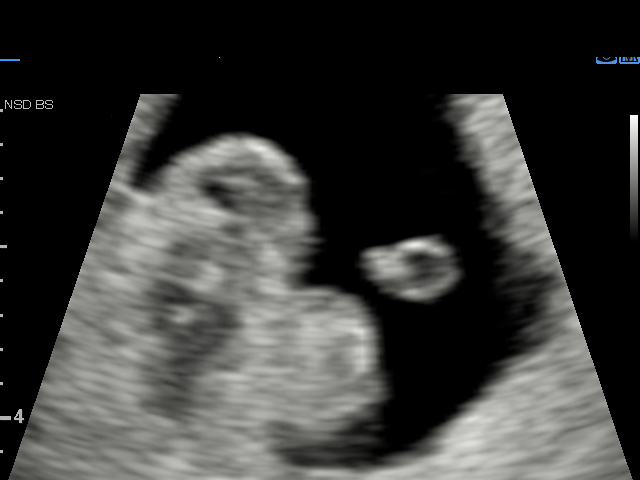
[im 11/24]
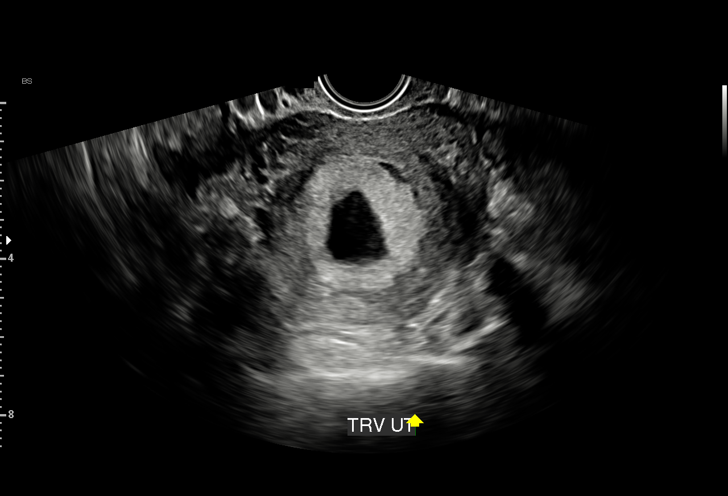
[im 13/24]
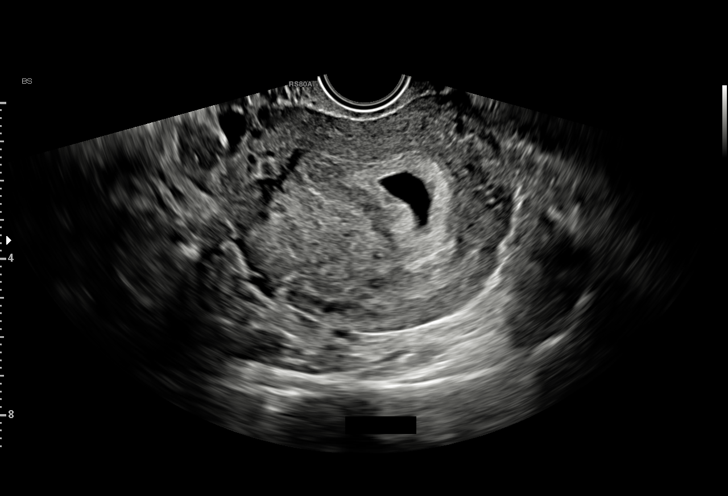
[im 14/24]
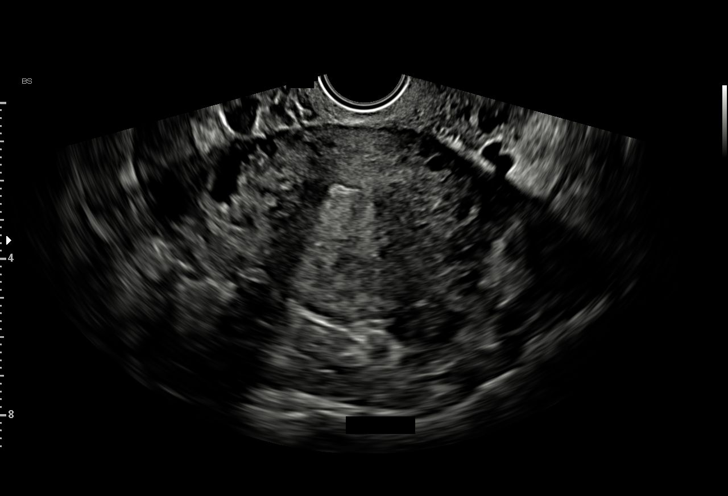
[im 16/24]
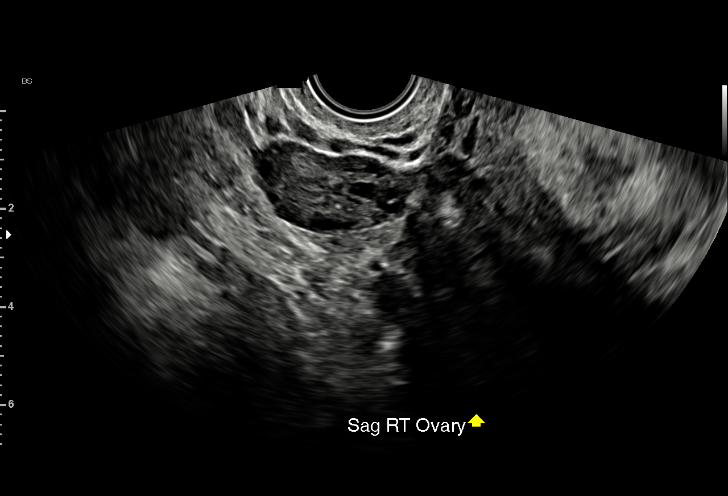
[im 17/24]
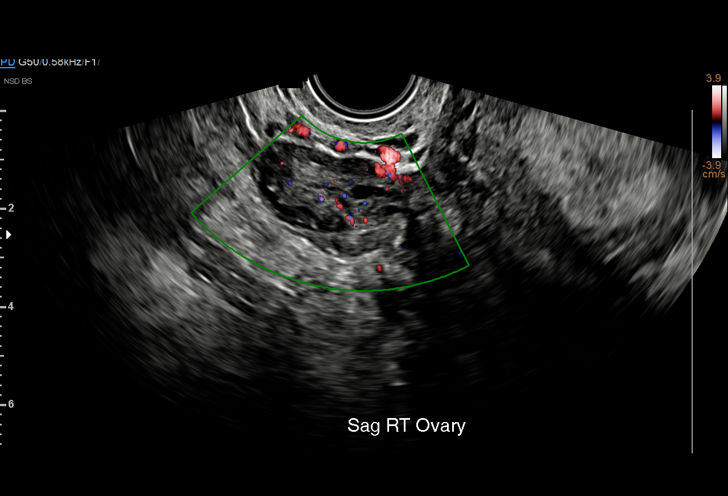
[im 19/24]
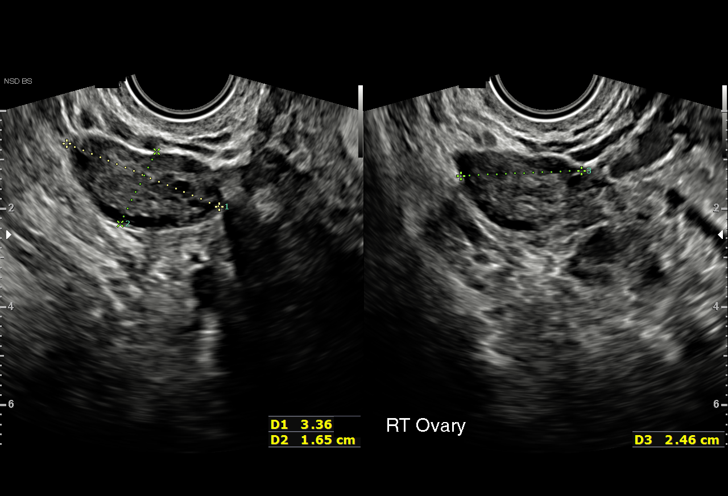
[im 21/24]
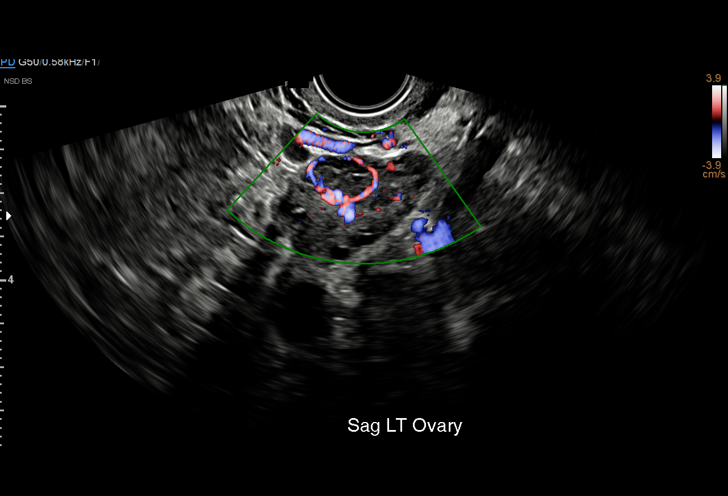
[im 22/24]
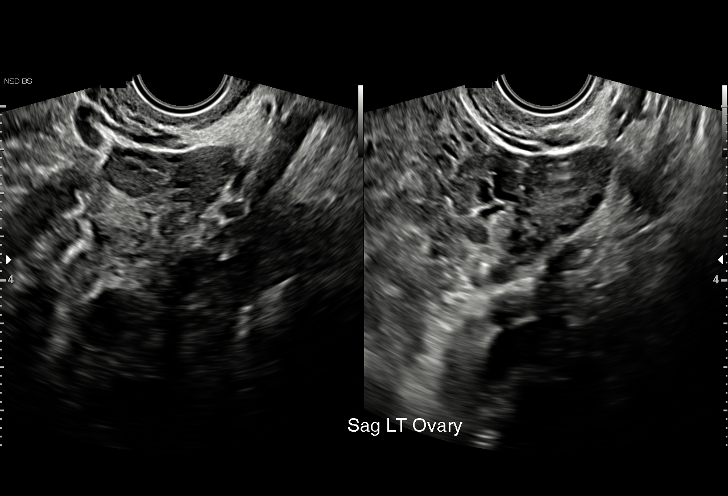
[im 24/24]
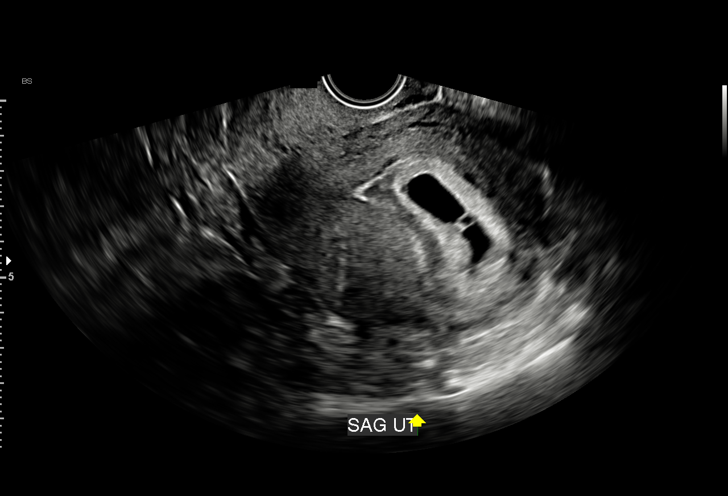

[15 of 24 positions shown; findings below may reference images not displayed]

FINDINGS: Intrauterine gestational sac: Present

Yolk sac:  Present

Embryo:  Present

Cardiac Activity: Present

Heart Rate: 111 bpm

CRL:   3.1 mm mm   5 w 6 d                  US EDC: 01/09/2021

Subchorionic hemorrhage:  None visualized.

Maternal uterus/adnexae: Within normal limits.
IMPRESSION: Single live intrauterine gestation at 5 weeks 6 days. Follow-up as
clinically indicated.
